# Patient Record
Sex: Male | Born: 1975 | Race: White | Hispanic: No | Marital: Married | Smoking: Never smoker
Health system: Southern US, Community
[De-identification: ages and names within clinical notes are randomized; demographics above are authoritative.]

## PROBLEM LIST (undated history)

## (undated) DIAGNOSIS — M25539 Pain in unspecified wrist: Secondary | ICD-10-CM

## (undated) DIAGNOSIS — F909 Attention-deficit hyperactivity disorder, unspecified type: Secondary | ICD-10-CM

## (undated) DIAGNOSIS — Z9109 Other allergy status, other than to drugs and biological substances: Secondary | ICD-10-CM

## (undated) DIAGNOSIS — S43439A Superior glenoid labrum lesion of unspecified shoulder, initial encounter: Secondary | ICD-10-CM

## (undated) HISTORY — PX: TONSILLECTOMY AND ADENOIDECTOMY: SUR1326

---

## 2012-06-06 ENCOUNTER — Other Ambulatory Visit: Payer: Self-pay | Admitting: Occupational Medicine

## 2012-06-06 ENCOUNTER — Ambulatory Visit (HOSPITAL_BASED_OUTPATIENT_CLINIC_OR_DEPARTMENT_OTHER)
Admission: RE | Admit: 2012-06-06 | Discharge: 2012-06-06 | Disposition: A | Discharge status: Home / Self Care | Payer: Self-pay | Source: Ambulatory Visit | Attending: Occupational Medicine | Admitting: Occupational Medicine

## 2012-06-06 DIAGNOSIS — Z0000 Encounter for general adult medical examination without abnormal findings: Secondary | ICD-10-CM

## 2015-09-14 DIAGNOSIS — E7801 Familial hypercholesterolemia: Secondary | ICD-10-CM | POA: Insufficient documentation

## 2015-09-14 DIAGNOSIS — IMO0002 Reserved for concepts with insufficient information to code with codable children: Secondary | ICD-10-CM | POA: Insufficient documentation

## 2015-09-14 DIAGNOSIS — E78019 Familial hypercholesterolemia, unspecified: Secondary | ICD-10-CM | POA: Insufficient documentation

## 2015-09-14 DIAGNOSIS — F988 Other specified behavioral and emotional disorders with onset usually occurring in childhood and adolescence: Secondary | ICD-10-CM | POA: Insufficient documentation

## 2015-10-30 ENCOUNTER — Encounter (HOSPITAL_BASED_OUTPATIENT_CLINIC_OR_DEPARTMENT_OTHER): Payer: Self-pay | Admitting: *Deleted

## 2015-10-30 ENCOUNTER — Emergency Department (HOSPITAL_BASED_OUTPATIENT_CLINIC_OR_DEPARTMENT_OTHER): Payer: Managed Care, Other (non HMO)

## 2015-10-30 ENCOUNTER — Emergency Department (HOSPITAL_BASED_OUTPATIENT_CLINIC_OR_DEPARTMENT_OTHER)
Admission: EM | Admit: 2015-10-30 | Discharge: 2015-10-30 | Disposition: A | Discharge status: Home / Self Care | Payer: Managed Care, Other (non HMO) | Attending: Emergency Medicine | Admitting: Emergency Medicine

## 2015-10-30 DIAGNOSIS — Y998 Other external cause status: Secondary | ICD-10-CM | POA: Insufficient documentation

## 2015-10-30 DIAGNOSIS — Z79899 Other long term (current) drug therapy: Secondary | ICD-10-CM | POA: Diagnosis not present

## 2015-10-30 DIAGNOSIS — S4992XA Unspecified injury of left shoulder and upper arm, initial encounter: Secondary | ICD-10-CM | POA: Diagnosis present

## 2015-10-30 DIAGNOSIS — Y9389 Activity, other specified: Secondary | ICD-10-CM | POA: Insufficient documentation

## 2015-10-30 DIAGNOSIS — S66812A Strain of other specified muscles, fascia and tendons at wrist and hand level, left hand, initial encounter: Secondary | ICD-10-CM | POA: Diagnosis not present

## 2015-10-30 DIAGNOSIS — Y92410 Unspecified street and highway as the place of occurrence of the external cause: Secondary | ICD-10-CM | POA: Diagnosis not present

## 2015-10-30 DIAGNOSIS — S46812A Strain of other muscles, fascia and tendons at shoulder and upper arm level, left arm, initial encounter: Secondary | ICD-10-CM | POA: Insufficient documentation
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
## 2015-10-30 NOTE — ED Notes (Signed)
Patient transported to X-ray 
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
## 2015-10-30 NOTE — ED Notes (Signed)
Has full ROM of left hand and left arm and shoulder movement, has pain increase in left shoulder with movement, with rest and non-movement pain is reduced

## 2015-11-08 ENCOUNTER — Ambulatory Visit (HOSPITAL_BASED_OUTPATIENT_CLINIC_OR_DEPARTMENT_OTHER)
Admission: RE | Admit: 2015-11-08 | Discharge: 2015-11-08 | Disposition: A | Discharge status: Home / Self Care | Payer: Managed Care, Other (non HMO) | Source: Ambulatory Visit | Attending: Family Medicine | Admitting: Family Medicine

## 2015-11-08 ENCOUNTER — Ambulatory Visit (INDEPENDENT_AMBULATORY_CARE_PROVIDER_SITE_OTHER): Payer: Managed Care, Other (non HMO) | Admitting: Family Medicine

## 2015-11-08 ENCOUNTER — Encounter: Payer: Self-pay | Admitting: Family Medicine

## 2015-11-08 VITALS — BP 140/82 | HR 71 | Ht 74.0 in | Wt 186.0 lb

## 2015-11-08 DIAGNOSIS — S4992XA Unspecified injury of left shoulder and upper arm, initial encounter: Secondary | ICD-10-CM

## 2015-11-08 DIAGNOSIS — M25532 Pain in left wrist: Secondary | ICD-10-CM | POA: Diagnosis not present

## 2015-11-08 DIAGNOSIS — S6992XA Unspecified injury of left wrist, hand and finger(s), initial encounter: Secondary | ICD-10-CM | POA: Diagnosis not present

## 2015-11-08 MED ORDER — BENZONATATE 100 MG PO CAPS: 100.0000 mg | ORAL_CAPSULE | Freq: Three times a day (TID) | ORAL | Status: DC | PRN
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
## 2015-11-09 DIAGNOSIS — S6992XA Unspecified injury of left wrist, hand and finger(s), initial encounter: Secondary | ICD-10-CM | POA: Insufficient documentation

## 2015-11-09 DIAGNOSIS — S4992XA Unspecified injury of left shoulder and upper arm, initial encounter: Secondary | ICD-10-CM | POA: Insufficient documentation
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
## 2015-11-14 ENCOUNTER — Telehealth: Payer: Self-pay | Admitting: Family Medicine

## 2015-11-14 ENCOUNTER — Encounter: Payer: Self-pay | Admitting: Family Medicine

## 2015-11-14 NOTE — Telephone Encounter (Signed)
Note provided

## 2015-11-21 ENCOUNTER — Telehealth: Payer: Self-pay | Admitting: Family Medicine

## 2015-11-22 NOTE — Addendum Note (Signed)
Addended by: Kathi Simpers F on: 11/22/2015 08:40 AM   Modules accepted: Orders

## 2015-12-02 NOTE — Addendum Note (Signed)
Addended by: Lenda KelpHUDNALL, SHANE R on: 12/02/2015 10:01 AM   Modules accepted: SmartSet

## 2015-12-02 NOTE — Addendum Note (Signed)
Addended by: Kathi SimpersWISE, Sallie Staron F on: 12/02/2015 11:18 AM   Modules accepted: Orders

## 2015-12-07 ENCOUNTER — Encounter: Payer: Self-pay | Admitting: Family Medicine

## 2015-12-07 ENCOUNTER — Ambulatory Visit (INDEPENDENT_AMBULATORY_CARE_PROVIDER_SITE_OTHER): Payer: Managed Care, Other (non HMO) | Admitting: Family Medicine

## 2015-12-07 VITALS — BP 125/79 | HR 76 | Ht 74.0 in | Wt 185.0 lb

## 2015-12-07 DIAGNOSIS — M75100 Unspecified rotator cuff tear or rupture of unspecified shoulder, not specified as traumatic: Secondary | ICD-10-CM | POA: Diagnosis not present

## 2015-12-07 DIAGNOSIS — S6992XD Unspecified injury of left wrist, hand and finger(s), subsequent encounter: Secondary | ICD-10-CM

## 2015-12-07 DIAGNOSIS — S4992XA Unspecified injury of left shoulder and upper arm, initial encounter: Secondary | ICD-10-CM | POA: Diagnosis not present

## 2015-12-08 ENCOUNTER — Encounter: Payer: Self-pay | Admitting: Family Medicine
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
## 2015-12-09 ENCOUNTER — Other Ambulatory Visit: Payer: Self-pay | Admitting: Family Medicine

## 2015-12-09 DIAGNOSIS — M259 Joint disorder, unspecified: Secondary | ICD-10-CM | POA: Insufficient documentation

## 2015-12-09 DIAGNOSIS — M75102 Unspecified rotator cuff tear or rupture of left shoulder, not specified as traumatic: Secondary | ICD-10-CM

## 2015-12-10 ENCOUNTER — Ambulatory Visit (HOSPITAL_BASED_OUTPATIENT_CLINIC_OR_DEPARTMENT_OTHER): Payer: Managed Care, Other (non HMO)

## 2015-12-10 ENCOUNTER — Ambulatory Visit (HOSPITAL_BASED_OUTPATIENT_CLINIC_OR_DEPARTMENT_OTHER)
Admission: RE | Admit: 2015-12-10 | Discharge: 2015-12-10 | Disposition: A | Discharge status: Home / Self Care | Payer: Managed Care, Other (non HMO) | Source: Ambulatory Visit | Attending: Family Medicine | Admitting: Family Medicine

## 2015-12-10 DIAGNOSIS — M75102 Unspecified rotator cuff tear or rupture of left shoulder, not specified as traumatic: Secondary | ICD-10-CM | POA: Diagnosis present

## 2015-12-10 DIAGNOSIS — S43492A Other sprain of left shoulder joint, initial encounter: Secondary | ICD-10-CM | POA: Diagnosis not present

## 2015-12-14 ENCOUNTER — Encounter: Payer: Self-pay | Admitting: Family Medicine

## 2015-12-14 ENCOUNTER — Ambulatory Visit (INDEPENDENT_AMBULATORY_CARE_PROVIDER_SITE_OTHER): Payer: Managed Care, Other (non HMO) | Admitting: Family Medicine

## 2015-12-14 ENCOUNTER — Other Ambulatory Visit (HOSPITAL_COMMUNITY): Payer: Self-pay | Admitting: Orthopedic Surgery

## 2015-12-14 VITALS — BP 127/91 | HR 89 | Ht 74.0 in | Wt 185.0 lb

## 2015-12-14 DIAGNOSIS — S4992XD Unspecified injury of left shoulder and upper arm, subsequent encounter: Secondary | ICD-10-CM

## 2015-12-14 DIAGNOSIS — M25532 Pain in left wrist: Secondary | ICD-10-CM

## 2015-12-14 MED ORDER — METHYLPREDNISOLONE ACETATE 40 MG/ML IJ SUSP
40.0000 mg | Freq: Once | INTRAMUSCULAR | Status: AC
  Administered 2015-12-14: 40 mg via INTRA_ARTICULAR
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
## 2015-12-20 ENCOUNTER — Ambulatory Visit (HOSPITAL_COMMUNITY)
Admission: RE | Admit: 2015-12-20 | Discharge: 2015-12-20 | Disposition: A | Discharge status: Home / Self Care | Payer: Managed Care, Other (non HMO) | Source: Ambulatory Visit | Attending: Orthopedic Surgery | Admitting: Orthopedic Surgery

## 2015-12-20 ENCOUNTER — Encounter (HOSPITAL_COMMUNITY): Payer: Managed Care, Other (non HMO)

## 2015-12-20 DIAGNOSIS — M25832 Other specified joint disorders, left wrist: Secondary | ICD-10-CM | POA: Diagnosis not present

## 2015-12-20 DIAGNOSIS — M25532 Pain in left wrist: Secondary | ICD-10-CM | POA: Diagnosis not present

## 2015-12-20 MED ORDER — TECHNETIUM TC 99M MEDRONATE IV KIT
25.0000 | PACK | Freq: Once | INTRAVENOUS | Status: AC | PRN
  Administered 2015-12-20: 25 via INTRAVENOUS

## 2015-12-27 ENCOUNTER — Telehealth: Payer: Self-pay | Admitting: Family Medicine

## 2015-12-27 NOTE — Telephone Encounter (Signed)
That's disappointing - ok to set him up for physical therapy for his shoulder - labral tear, paralabral cyst.  Thanks!

## 2015-12-27 NOTE — Addendum Note (Signed)
Addended by: Kathi SimpersWISE, Chundra Sauerwein F on: 12/27/2015 02:21 PM   Modules accepted: Orders

## 2015-12-28 NOTE — Telephone Encounter (Signed)
Spoke to patient and order sent for physical therapy.

## 2015-12-29 ENCOUNTER — Ambulatory Visit: Payer: Managed Care, Other (non HMO) | Attending: Family Medicine | Admitting: Physical Therapy

## 2015-12-29 DIAGNOSIS — M25612 Stiffness of left shoulder, not elsewhere classified: Secondary | ICD-10-CM | POA: Diagnosis present

## 2015-12-29 DIAGNOSIS — M25512 Pain in left shoulder: Secondary | ICD-10-CM | POA: Diagnosis present
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
## 2016-01-03 ENCOUNTER — Other Ambulatory Visit: Payer: Self-pay | Admitting: Orthopedic Surgery

## 2016-01-03 ENCOUNTER — Ambulatory Visit: Payer: Managed Care, Other (non HMO) | Attending: Family Medicine

## 2016-01-03 DIAGNOSIS — M25512 Pain in left shoulder: Secondary | ICD-10-CM | POA: Diagnosis not present

## 2016-01-03 DIAGNOSIS — M25612 Stiffness of left shoulder, not elsewhere classified: Secondary | ICD-10-CM | POA: Diagnosis present

## 2016-01-03 NOTE — Telephone Encounter (Signed)
Finished

## 2016-01-04 ENCOUNTER — Encounter (HOSPITAL_BASED_OUTPATIENT_CLINIC_OR_DEPARTMENT_OTHER): Payer: Self-pay | Admitting: *Deleted

## 2016-01-04 NOTE — Therapy (Signed)
[facility] [address]  [address] [place], [place], [address] Phone: [phone]   Fax:  [phone]  Physical Therapy Treatment  Patient Details  Name: Dan Villarreal MRN: [id number] Date of Birth: 04-22-1976 Referring Provider: Lenda Kelp, MD  Encounter Date: 01/03/2016      PT End of Session - 01/03/16 1547    Visit Number 2   Number of Visits 12   Date for PT Re-Evaluation 02/09/16   PT Start Time 1536   PT Stop Time 1632   PT Time Calculation (min) 56 min   Activity Tolerance Patient tolerated treatment well   Behavior During Therapy [facility] for tasks assessed/performed      Past Medical History  Diagnosis Date  . ADHD (attention deficit hyperactivity disorder)   . Environmental allergies   . Labral tear of shoulder   . Pain, wrist joint     Past Surgical History  Procedure Laterality Date  . Tonsillectomy and adenoidectomy      There were no vitals filed for this visit.  Visit Diagnosis:  Pain in left shoulder  Stiffness of left shoulder, not elsewhere classified      Subjective Assessment - 01/03/16 1531    Subjective Pt. reports L wrist with no pain initially just with exertion, L shoulder no pain initially.     Patient Stated Goals "To get normal use of my (L) arm back so I can go back to full duty at work and get back to working out."   Currently in Pain? No/denies   Pain Score 0-No pain   Multiple Pain Sites No   Pain Onset --      Today's treatment: Therex: Standing B shoulder circles CW, CCW x 3 min for warmup   Manual: L shoulder inferior, posterior glide mobs grade 3  L shoulder IR, ER, flexion, abduction PROM  Manual STM / TPR to L medial scapular border and L UT 5 min  Scapular mobs all directions   Therex: * All TB activity with TB wrapped around L wrist brace AAROM with pulley L shoulder flexion, abduction x 15 each way 5" hold each Standing ER, IR with red TB wrapped around  wrist 2 x 15 reps  Standing B shoulder extension with green TB x 15 reps;  B shoulder extension with blue TB x 15 reps Standing B shoulder retraction row with blue TB x 15 reps; Standing B shoulder retraction row with black TB x 15 reps Seated low row with strap assist on BATCA machine x 15 reps 25# Laying on Large p-ball:     I's  X 15 reps      T's x 15 reps     Y's x 12 reps; no pain reported however stopped due to fatigue at 12 reps         PT Long Term Goals - 01/04/16 1541    PT LONG TERM GOAL #1   Title Pt will be independent with latest HEP by 02/09/16   Status On-going   PT LONG TERM GOAL #2   Title Pt will demonstrate full AROM of L shoulder without pain by 02/09/16   Status On-going   PT LONG TERM GOAL #3   Title Pt will demonstrate L shoulder strength 4+/5 or greater by 02/09/16   Status On-going   PT LONG TERM GOAL #4   Title Pt will report no restrictions in functional use of L UE with normal household ADL's or chores  due to L shoulder pain or weakness by 02/09/16   Status On-going               Plan - 01/03/16 1548    Clinical Impression Statement Pt. tolerated all L scapular strengthening and L shoulder stretching well today only pain in L wrist with TB pressure with therex; all TB resistance activity for shoulder and scapular strengthening performed by wrapping TB around wrist brace; pt. unable to grip with any activity.  Pt. reports L wrist surgery scheduled for next Tuesday; pt. instructed to contact PT regarding precautions and possibility of future treatment following surgery.  Pt. has one more scheduled PT treatment before surgery.  only therex related muscular "tightness" reported was at L posterior shoulder with resisted shoulder extension / retraction.     PT Next Visit Plan Update HEP to include stretches for increased ROM, Manual therapy and exercises for L shoulder ROM and strengthening; Modalities as needed for pain        Problem List Patient  Active Problem List   Diagnosis Date Noted  . Disorder of wrist joint 12/09/2015  . Injury of left shoulder 11/09/2015  . Left wrist injury 11/09/2015  . ADD (attention deficit disorder) 09/14/2015  . Familial hypercholesterolemia 09/14/2015  . Herniated nucleus pulposus 09/14/2015    Kermit BaloMicah Mykael Batz, PTA 01/04/2016, 4:02 PM  [facility] [address]  [address] [place], [place], [address] Phone: [phone]   Fax:  [phone]  Name: Dan Villarreal MRN: [id number] Date of Birth: 07/20/1976

## 2016-01-05 ENCOUNTER — Telehealth: Payer: Self-pay | Admitting: Family Medicine

## 2016-01-05 ENCOUNTER — Ambulatory Visit: Payer: Managed Care, Other (non HMO)

## 2016-01-05 DIAGNOSIS — M25512 Pain in left shoulder: Secondary | ICD-10-CM | POA: Diagnosis not present

## 2016-01-05 DIAGNOSIS — M25612 Stiffness of left shoulder, not elsewhere classified: Secondary | ICD-10-CM

## 2016-01-05 NOTE — Telephone Encounter (Signed)
Spoke to patient and gave him information provided.  

## 2016-01-05 NOTE — Telephone Encounter (Signed)
It may be best for him to get that from Dr. Merlyn LotKuzma as he will be more familiar with the rehab, recovery process after that surgery.  He can provide them with the appropriate details for that.

## 2016-01-05 NOTE — Therapy (Signed)
[facility] [address]  [address] [place], [place], [address] Phone: [phone]   Fax:  [phone]  Physical Therapy Treatment  Patient Details  Name: Dan Villarreal MRN: [id number] Date of Birth: [birth year] Referring Provider: Lenda Kelp, MD  Encounter Date: 01/05/2016      PT End of Session - 01/05/16 1250    Visit Number 3   Number of Visits 12   Date for PT Re-Evaluation 02/09/16   PT Start Time 1618   PT Stop Time 1702   PT Time Calculation (min) 44 min   Activity Tolerance Patient tolerated treatment well   Behavior During Therapy [facility] for tasks assessed/performed      Past Medical History  Diagnosis Date  . ADHD (attention deficit hyperactivity disorder)   . Environmental allergies   . Labral tear of shoulder   . Pain, wrist joint     Past Surgical History  Procedure Laterality Date  . Tonsillectomy and adenoidectomy      There were no vitals filed for this visit.  Visit Diagnosis:  Pain in left shoulder  Stiffness of left shoulder, not elsewhere classified      Subjective Assessment - 01/05/16 1240    Subjective No pain or other complaints today.  Pt. reports L wrist only with pain when gripping.  Pt. reports L wrist surgery scheduled for Tuesday 4/11.   Patient Stated Goals "To get normal use of my (L) arm back so I can go back to full duty at work and get back to working out."   Currently in Pain? No/denies   Pain Score 0-No pain   Multiple Pain Sites No      Today's treatment:  Therex: Standing B shoulder circles CW, CCW x 3 min for warmup   Manual: L shoulder inferior, posterior glide mobs grade 3  L shoulder IR, ER, flexion, abduction PROM  Manual STM / TPR to L medial scapular border and L UT 5 min  Scapular mobs all directions   AAROM L shoulder ER with wand x 10 reps AAROM L shoulder abduction with wand x 10 reps AAROM L shoulder flexion with want x 10 reps  AROM L  shoulder flexion on big red (75 cm) physioball x 10 reps with   Standing scapular retraction with (looped) black TB x 15 reps Standing scapular retraction with B shoulder extension with blue TB x 15 reps  B BATCA Pulldown with wrist strap assist.            PT Long Term Goals - 01/04/16 1541    PT LONG TERM GOAL #1   Title Pt will be independent with latest HEP by 02/09/16   Status On-going   PT LONG TERM GOAL #2   Title Pt will demonstrate full AROM of L shoulder without pain by 02/09/16   Status On-going   PT LONG TERM GOAL #3   Title Pt will demonstrate L shoulder strength 4+/5 or greater by 02/09/16   Status On-going   PT LONG TERM GOAL #4   Title Pt will report no restrictions in functional use of L UE with normal household ADL's or chores due to L shoulder pain or weakness by 02/09/16   Status On-going               Plan - 01/06/16 1243    Clinical Impression Statement Pt. tolerated all L scapular strengthening and L shoulder stretching well today onlyu pain in L wrist  with TB pressure with therex; all TB resistance activity for shoulder and scapular strengthening performed by wrapping TB around wrist brace; pt. unable to grip with any activity without pain.  STM with red weight theraball with self massage on wall added today with pt. tolerating well reporting relieved tightness following.  Pt. reports L wrist surgery scheduled for next Tuesday 4/11; pt. instructed to contact PT regarding precautions and possibility of future treatment following surgery. Pt. has no more scheduled PT treatments before surgery.     PT Next Visit Plan Update HEP to include stretches for increased ROM, Manual therapy and exercises for L shoulder ROM and strengthening; Modalities as needed for pain        Problem List Patient Active Problem List   Diagnosis Date Noted  . Disorder of wrist joint 12/09/2015  . Injury of left shoulder 11/09/2015  . Left wrist injury 11/09/2015  . ADD  (attention deficit disorder) 09/14/2015  . Familial hypercholesterolemia 09/14/2015  . Herniated nucleus pulposus 09/14/2015    Kermit BaloMicah Neesha Langton, PTA 01/06/2016, 1:03 PM  [facility] [address]  [address] [place], [place], [address] Phone: [phone]   Fax:  [phone]  Name: Dan Villarreal MRN: [id number] Date of Birth: 11/30/1975

## 2016-01-05 NOTE — Telephone Encounter (Signed)
His left wrist having surgery on 01/10/16 w/ Dr.Kuzma.  He needs a letter for FMLA to extend it because of the surgery, to say March 15, 2016.  Please call w/ any questions.

## 2016-01-10 ENCOUNTER — Encounter (HOSPITAL_BASED_OUTPATIENT_CLINIC_OR_DEPARTMENT_OTHER)
Admission: RE | Disposition: A | Discharge status: Home / Self Care | Payer: Self-pay | Source: Ambulatory Visit | Attending: Orthopedic Surgery

## 2016-01-10 ENCOUNTER — Encounter (HOSPITAL_BASED_OUTPATIENT_CLINIC_OR_DEPARTMENT_OTHER): Payer: Self-pay

## 2016-01-10 ENCOUNTER — Ambulatory Visit (HOSPITAL_BASED_OUTPATIENT_CLINIC_OR_DEPARTMENT_OTHER): Payer: Managed Care, Other (non HMO) | Admitting: Anesthesiology

## 2016-01-10 ENCOUNTER — Ambulatory Visit (HOSPITAL_BASED_OUTPATIENT_CLINIC_OR_DEPARTMENT_OTHER)
Admission: RE | Admit: 2016-01-10 | Discharge: 2016-01-10 | Disposition: A | Discharge status: Home / Self Care | Payer: Managed Care, Other (non HMO) | Source: Ambulatory Visit | Attending: Orthopedic Surgery | Admitting: Orthopedic Surgery

## 2016-01-10 DIAGNOSIS — S63592A Other specified sprain of left wrist, initial encounter: Secondary | ICD-10-CM | POA: Insufficient documentation

## 2016-01-10 HISTORY — DX: Pain in unspecified wrist: M25.539

## 2016-01-10 HISTORY — DX: Other allergy status, other than to drugs and biological substances: Z91.09

## 2016-01-10 HISTORY — DX: Attention-deficit hyperactivity disorder, unspecified type: F90.9

## 2016-01-10 HISTORY — PX: WRIST ARTHROSCOPY WITH DEBRIDEMENT: SHX6194

## 2016-01-10 HISTORY — DX: Superior glenoid labrum lesion of unspecified shoulder, initial encounter: S43.439A

## 2016-01-10 SURGERY — WRIST ARTHROSCOPY WITH DEBRIDEMENT
Anesthesia: General | Site: Wrist | Laterality: Left

## 2016-01-10 MED ORDER — GLYCOPYRROLATE 0.2 MG/ML IJ SOLN
0.2000 mg | Freq: Once | INTRAMUSCULAR | Status: AC | PRN
  Administered 2016-01-10 (×2): 0.2 mg via INTRAVENOUS

## 2016-01-10 MED ORDER — PHENYLEPHRINE HCL 10 MG/ML IJ SOLN
INTRAMUSCULAR | Status: DC | PRN
  Administered 2016-01-10: 80 ug via INTRAVENOUS

## 2016-01-10 MED ORDER — HYDROMORPHONE HCL 1 MG/ML IJ SOLN: 0.2500 mg | INTRAMUSCULAR | Status: DC | PRN

## 2016-01-10 MED ORDER — SODIUM CHLORIDE 0.9 % IV SOLN
INTRAVENOUS | Status: DC | PRN
  Administered 2016-01-10: 1000 mL

## 2016-01-10 MED ORDER — CHLORHEXIDINE GLUCONATE 4 % EX LIQD: 60.0000 mL | Freq: Once | CUTANEOUS | Status: DC

## 2016-01-10 MED ORDER — MEPERIDINE HCL 25 MG/ML IJ SOLN: 6.2500 mg | INTRAMUSCULAR | Status: DC | PRN

## 2016-01-10 MED ORDER — MIDAZOLAM HCL 2 MG/2ML IJ SOLN
1.0000 mg | INTRAMUSCULAR | Status: DC | PRN
  Administered 2016-01-10: 2 mg via INTRAVENOUS

## 2016-01-10 MED ORDER — OXYCODONE HCL 5 MG/5ML PO SOLN: 5.0000 mg | Freq: Once | ORAL | Status: DC | PRN

## 2016-01-10 MED ORDER — LACTATED RINGERS IV SOLN
INTRAVENOUS | Status: DC
  Administered 2016-01-10 (×2): via INTRAVENOUS

## 2016-01-10 MED ORDER — DEXAMETHASONE SODIUM PHOSPHATE 4 MG/ML IJ SOLN
INTRAMUSCULAR | Status: DC | PRN
  Administered 2016-01-10: 10 mg via INTRAVENOUS

## 2016-01-10 MED ORDER — MIDAZOLAM HCL 5 MG/5ML IJ SOLN
INTRAMUSCULAR | Status: DC | PRN
  Administered 2016-01-10: 2 mg via INTRAVENOUS

## 2016-01-10 MED ORDER — HYDROCODONE-ACETAMINOPHEN 5-325 MG PO TABS: 1.0000 | ORAL_TABLET | Freq: Four times a day (QID) | ORAL | Status: AC | PRN

## 2016-01-10 MED ORDER — DEXAMETHASONE SODIUM PHOSPHATE 10 MG/ML IJ SOLN
INTRAMUSCULAR | Status: AC
  Filled 2016-01-10: qty 1

## 2016-01-10 MED ORDER — FENTANYL CITRATE (PF) 100 MCG/2ML IJ SOLN
50.0000 ug | INTRAMUSCULAR | Status: DC | PRN
  Administered 2016-01-10: 100 ug via INTRAVENOUS

## 2016-01-10 MED ORDER — OXYCODONE HCL 5 MG PO TABS: 5.0000 mg | ORAL_TABLET | Freq: Once | ORAL | Status: DC | PRN

## 2016-01-10 MED ORDER — CEFAZOLIN SODIUM-DEXTROSE 2-4 GM/100ML-% IV SOLN
INTRAVENOUS | Status: AC
  Filled 2016-01-10: qty 100

## 2016-01-10 MED ORDER — NALOXONE HCL 0.4 MG/ML IJ SOLN
INTRAMUSCULAR | Status: AC
  Filled 2016-01-10: qty 1

## 2016-01-10 MED ORDER — GLYCOPYRROLATE 0.2 MG/ML IJ SOLN
INTRAMUSCULAR | Status: AC
  Filled 2016-01-10: qty 1

## 2016-01-10 MED ORDER — LIDOCAINE HCL (CARDIAC) 20 MG/ML IV SOLN
INTRAVENOUS | Status: AC
  Filled 2016-01-10: qty 5

## 2016-01-10 MED ORDER — FENTANYL CITRATE (PF) 100 MCG/2ML IJ SOLN
INTRAMUSCULAR | Status: AC
Start: 2016-01-10 — End: 2016-01-10
  Filled 2016-01-10: qty 2

## 2016-01-10 MED ORDER — CEFAZOLIN SODIUM-DEXTROSE 2-4 GM/100ML-% IV SOLN
2.0000 g | INTRAVENOUS | Status: AC
  Administered 2016-01-10: 2 g via INTRAVENOUS

## 2016-01-10 MED ORDER — LIDOCAINE HCL (CARDIAC) 20 MG/ML IV SOLN
INTRAVENOUS | Status: DC | PRN
  Administered 2016-01-10: 50 mg via INTRAVENOUS

## 2016-01-10 MED ORDER — PHENYLEPHRINE 40 MCG/ML (10ML) SYRINGE FOR IV PUSH (FOR BLOOD PRESSURE SUPPORT)
PREFILLED_SYRINGE | INTRAVENOUS | Status: AC
  Filled 2016-01-10: qty 10

## 2016-01-10 MED ORDER — MIDAZOLAM HCL 2 MG/2ML IJ SOLN
INTRAMUSCULAR | Status: AC
  Filled 2016-01-10: qty 2

## 2016-01-10 MED ORDER — BUPIVACAINE-EPINEPHRINE (PF) 0.5% -1:200000 IJ SOLN
INTRAMUSCULAR | Status: DC | PRN
  Administered 2016-01-10: 25 mL via PERINEURAL

## 2016-01-10 MED ORDER — ONDANSETRON HCL 4 MG/2ML IJ SOLN
INTRAMUSCULAR | Status: AC
  Filled 2016-01-10: qty 2

## 2016-01-10 MED ORDER — FENTANYL CITRATE (PF) 100 MCG/2ML IJ SOLN
INTRAMUSCULAR | Status: AC
  Filled 2016-01-10: qty 2

## 2016-01-10 MED ORDER — PROPOFOL 10 MG/ML IV BOLUS
INTRAVENOUS | Status: DC | PRN
  Administered 2016-01-10: 200 mg via INTRAVENOUS

## 2016-01-10 MED ORDER — SCOPOLAMINE 1 MG/3DAYS TD PT72: 1.0000 | MEDICATED_PATCH | Freq: Once | TRANSDERMAL | Status: DC | PRN

## 2016-01-10 MED ORDER — EPHEDRINE SULFATE 50 MG/ML IJ SOLN
INTRAMUSCULAR | Status: DC | PRN
  Administered 2016-01-10: 10 mg via INTRAVENOUS

## 2016-01-10 MED ORDER — FENTANYL CITRATE (PF) 100 MCG/2ML IJ SOLN
INTRAMUSCULAR | Status: DC | PRN
  Administered 2016-01-10: 100 ug via INTRAVENOUS

## 2016-01-10 MED ORDER — PROPOFOL 10 MG/ML IV BOLUS
INTRAVENOUS | Status: AC
  Filled 2016-01-10: qty 40

## 2016-01-10 MED ORDER — EPHEDRINE SULFATE 50 MG/ML IJ SOLN
INTRAMUSCULAR | Status: AC
  Filled 2016-01-10: qty 1

## 2016-01-10 SURGICAL SUPPLY — 82 items
BLADE CUDA 2.0 (BLADE) IMPLANT
BLADE EAR TYMPAN 2.5 60D BEAV (BLADE) IMPLANT
BLADE MINI RND TIP GREEN BEAV (BLADE) IMPLANT
BLADE SURG 15 STRL LF DISP TIS (BLADE) ×1 IMPLANT
BLADE SURG 15 STRL SS (BLADE) ×2
BNDG COHESIVE 3X5 TAN STRL LF (GAUZE/BANDAGES/DRESSINGS) ×3 IMPLANT
BNDG ESMARK 4X9 LF (GAUZE/BANDAGES/DRESSINGS) ×3 IMPLANT
BNDG GAUZE ELAST 4 BULKY (GAUZE/BANDAGES/DRESSINGS) ×3 IMPLANT
BUR CUDA 2.9 (BURR) IMPLANT
BUR CUDA 2.9MM (BURR)
BUR FULL RADIUS 2.0 (BURR) IMPLANT
BUR FULL RADIUS 2.0MM (BURR)
BUR FULL RADIUS 2.9 (BURR) ×2 IMPLANT
BUR FULL RADIUS 2.9MM (BURR) ×1
BUR GATOR 2.9 (BURR) IMPLANT
BUR GATOR 2.9MM (BURR)
BUR SPHERICAL 2.9 (BURR) IMPLANT
BUR SPHERICAL 2.9MM (BURR)
CANISTER SUCT 1200ML W/VALVE (MISCELLANEOUS) ×3 IMPLANT
CHLORAPREP W/TINT 26ML (MISCELLANEOUS) ×3 IMPLANT
CORDS BIPOLAR (ELECTRODE) IMPLANT
COVER BACK TABLE 60X90IN (DRAPES) ×3 IMPLANT
COVER MAYO STAND STRL (DRAPES) ×3 IMPLANT
CUFF TOURNIQUET SINGLE 18IN (TOURNIQUET CUFF) ×3 IMPLANT
DRAPE EXTREMITY T 121X128X90 (DRAPE) ×3 IMPLANT
DRAPE IMP U-DRAPE 54X76 (DRAPES) ×3 IMPLANT
DRAPE OEC MINIVIEW 54X84 (DRAPES) IMPLANT
DRAPE SURG 17X23 STRL (DRAPES) ×3 IMPLANT
ELECT SMALL JOINT 90D BASC (ELECTRODE) IMPLANT
GAUZE SPONGE 4X4 12PLY STRL (GAUZE/BANDAGES/DRESSINGS) ×3 IMPLANT
GAUZE XEROFORM 1X8 LF (GAUZE/BANDAGES/DRESSINGS) ×3 IMPLANT
GLOVE BIOGEL PI IND STRL 7.0 (GLOVE) ×1 IMPLANT
GLOVE BIOGEL PI IND STRL 7.5 (GLOVE) ×2 IMPLANT
GLOVE BIOGEL PI IND STRL 8.5 (GLOVE) ×1 IMPLANT
GLOVE BIOGEL PI INDICATOR 7.0 (GLOVE) ×2
GLOVE BIOGEL PI INDICATOR 7.5 (GLOVE) ×4
GLOVE BIOGEL PI INDICATOR 8.5 (GLOVE) ×2
GLOVE SURG ORTHO 8.0 STRL STRW (GLOVE) ×3 IMPLANT
GLOVE SURG SS PI 7.0 STRL IVOR (GLOVE) ×6 IMPLANT
GOWN STRL REUS W/ TWL LRG LVL3 (GOWN DISPOSABLE) ×2 IMPLANT
GOWN STRL REUS W/TWL LRG LVL3 (GOWN DISPOSABLE) ×4
GOWN STRL REUS W/TWL XL LVL3 (GOWN DISPOSABLE) ×3 IMPLANT
IV NS IRRIG 3000ML ARTHROMATIC (IV SOLUTION) IMPLANT
IV SET EXT 30 76VOL 4 MALE LL (IV SETS) ×3 IMPLANT
MANIFOLD NEPTUNE II (INSTRUMENTS) IMPLANT
NDL SAFETY ECLIPSE 18X1.5 (NEEDLE) ×3 IMPLANT
NEEDLE HYPO 18GX1.5 SHARP (NEEDLE) ×6
NEEDLE HYPO 22GX1.5 SAFETY (NEEDLE) ×3 IMPLANT
NEEDLE SPNL 18GX3.5 QUINCKE PK (NEEDLE) IMPLANT
NEEDLE TUOHY 20GX3.5 (NEEDLE) IMPLANT
NS IRRIG 1000ML POUR BTL (IV SOLUTION) IMPLANT
PACK BASIN DAY SURGERY FS (CUSTOM PROCEDURE TRAY) ×3 IMPLANT
PAD CAST 3X4 CTTN HI CHSV (CAST SUPPLIES) ×1 IMPLANT
PADDING CAST ABS 3INX4YD NS (CAST SUPPLIES) ×2
PADDING CAST ABS 4INX4YD NS (CAST SUPPLIES) ×2
PADDING CAST ABS COTTON 3X4 (CAST SUPPLIES) ×1 IMPLANT
PADDING CAST ABS COTTON 4X4 ST (CAST SUPPLIES) ×1 IMPLANT
PADDING CAST COTTON 3X4 STRL (CAST SUPPLIES) ×2
ROUTER HOODED VORTEX 2.9MM (BLADE) IMPLANT
SET ARTHROSCOPY TUBING (MISCELLANEOUS) ×2
SET ARTHROSCOPY TUBING LN (MISCELLANEOUS) ×1 IMPLANT
SET SM JOINT TUBING/CANN (CANNULA) IMPLANT
SLEEVE SCD COMPRESS KNEE MED (MISCELLANEOUS) ×3 IMPLANT
SLING ARM FOAM STRAP LRG (SOFTGOODS) ×3 IMPLANT
SPLINT PLASTER CAST XFAST 3X15 (CAST SUPPLIES) IMPLANT
SPLINT PLASTER XTRA FASTSET 3X (CAST SUPPLIES)
STOCKINETTE 4X48 STRL (DRAPES) ×3 IMPLANT
SUCTION FRAZIER HANDLE 10FR (MISCELLANEOUS)
SUCTION TUBE FRAZIER 10FR DISP (MISCELLANEOUS) IMPLANT
SUT ETHILON 4 0 PS 2 18 (SUTURE) ×3 IMPLANT
SUT MERSILENE 4 0 P 3 (SUTURE) IMPLANT
SUT PDS AB 2-0 CT2 27 (SUTURE) IMPLANT
SUT STEEL 4 0 (SUTURE) IMPLANT
SUT VIC AB 2-0 PS2 27 (SUTURE) IMPLANT
SUT VICRYL 4-0 PS2 18IN ABS (SUTURE) IMPLANT
SYR BULB 3OZ (MISCELLANEOUS) ×3 IMPLANT
SYR CONTROL 10ML LL (SYRINGE) ×3 IMPLANT
TUBE CONNECTING 20'X1/4 (TUBING)
TUBE CONNECTING 20X1/4 (TUBING) IMPLANT
UNDERPAD 30X30 (UNDERPADS AND DIAPERS) ×3 IMPLANT
WAND 1.5 MICROBLATOR (SURGICAL WAND) IMPLANT
WATER STERILE IRR 1000ML POUR (IV SOLUTION) ×3 IMPLANT

## 2016-01-10 NOTE — Anesthesia Postprocedure Evaluation (Signed)
Anesthesia Post Note  Patient: Dan Villarreal  Procedure(s) Performed: Procedure(s) (LRB): ARTHROSCOPY LEFT WRIST DEBRIDEMENT TRIANGULAR FIBROCARTILAGE COMPLEX  (Left)  Patient location during evaluation: PACU Anesthesia Type: General Level of consciousness: awake and alert Pain management: pain level controlled Vital Signs Assessment: post-procedure vital signs reviewed and stable Respiratory status: spontaneous breathing, nonlabored ventilation and respiratory function stable Cardiovascular status: blood pressure returned to baseline and stable Postop Assessment: no signs of nausea or vomiting Anesthetic complications: no    Last Vitals:  Filed Vitals:   01/10/16 1145 01/10/16 1200  BP: 125/90 126/88  Pulse: 84 78  Temp:    Resp: 18 17    Last Pain:  Filed Vitals:   01/10/16 1206  PainSc: 0-No pain                 Rashena Dowling A

## 2016-01-10 NOTE — Op Note (Signed)
Dictation Number [id number]

## 2016-01-10 NOTE — Transfer of Care (Signed)
Immediate Anesthesia Transfer of Care Note  Patient: Dan Villarreal  Procedure(s) Performed: Procedure(s): ARTHROSCOPY LEFT WRIST DEBRIDEMENT TRIANGULAR FIBROCARTILAGE COMPLEX  (Left)  Patient Location: PACU  Anesthesia Type:GA combined with regional for post-op pain  Level of Consciousness: alert  and patient cooperative  Airway & Oxygen Therapy: Patient Spontanous Breathing and Patient connected to face mask oxygen  Post-op Assessment: Report given to RN and Post -op Vital signs reviewed and stable  Post vital signs: Reviewed and stable  Last Vitals:  Filed Vitals:   01/10/16 0838  BP: 132/80  Pulse: 64  Temp: 36.5 C  Resp: 18    Complications: No apparent anesthesia complications
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
## 2016-01-10 NOTE — Anesthesia Procedure Notes (Addendum)
Anesthesia Regional Block:  Infraclavicular brachial plexus block  Pre-Anesthetic Checklist: ,, timeout performed, Correct Patient, Correct Site, Correct Laterality, Correct Procedure, Correct Position, site marked, Risks and benefits discussed,  Surgical consent,  Pre-op evaluation,  At surgeon's request and post-op pain management  Laterality: Left and Upper  Prep: chloraprep       Needles:  Injection technique: Single-shot  Needle Type: Echogenic Stimulator Needle     Needle Length: 5cm 5 cm Needle Gauge: 21 and 21 G    Additional Needles:  Procedures: ultrasound guided (picture in chart) Infraclavicular brachial plexus block Narrative:  Start time: 01/10/2016 9:09 AM End time: 01/10/2016 9:14 AM Injection made incrementally with aspirations every 5 mL.  Performed by: Personally  Anesthesiologist: CREWS, DAVID   Procedure Name: LMA Insertion Date/Time: 01/10/2016 10:33 AM Performed by: [place] DesanctisLINKA, Fartun Paradiso L Pre-anesthesia Checklist: Patient identified, Emergency Drugs available, Suction available, Patient being monitored and Timeout performed Patient Re-evaluated:Patient Re-evaluated prior to inductionOxygen Delivery Method: Circle System Utilized Preoxygenation: Pre-oxygenation with 100% oxygen Intubation Type: IV induction Ventilation: Mask ventilation without difficulty LMA: LMA inserted LMA Size: 5.0 Number of attempts: 1 Airway Equipment and Method: Bite block Placement Confirmation: positive ETCO2 Tube secured with: Tape Dental Injury: Teeth and Oropharynx as per pre-operative assessment       Left Infraclavicular block image

## 2016-01-10 NOTE — Anesthesia Preprocedure Evaluation (Signed)
Anesthesia Evaluation  Patient identified by MRN, date of birth, ID band Patient awake    Reviewed: Allergy & Precautions, NPO status , Patient's Chart, lab work & pertinent test results  Airway Mallampati: I  TM Distance: >3 FB Neck ROM: Full    Dental  (+) Teeth Intact, Dental Advisory Given   Pulmonary    breath sounds clear to auscultation       Cardiovascular  Rhythm:Regular Rate:Normal     Neuro/Psych    GI/Hepatic   Endo/Other    Renal/GU      Musculoskeletal   Abdominal   Peds  Hematology   Anesthesia Other Findings   Reproductive/Obstetrics                             Anesthesia Physical Anesthesia Plan  ASA: I  Anesthesia Plan: General   Post-op Pain Management: GA combined w/ Regional for post-op pain   Induction: Intravenous  Airway Management Planned: LMA  Additional Equipment:   Intra-op Plan:   Post-operative Plan: Extubation in OR  Informed Consent: I have reviewed the patients History and Physical, chart, labs and discussed the procedure including the risks, benefits and alternatives for the proposed anesthesia with the patient or authorized representative who has indicated his/her understanding and acceptance.   Dental advisory given  Plan Discussed with: CRNA, Surgeon and Anesthesiologist  Anesthesia Plan Comments:         Anesthesia Quick Evaluation  

## 2016-01-10 NOTE — Brief Op Note (Signed)
01/10/2016  11:21 AM  PATIENT:  Dan Villarreal  40 y.o. male  PRE-OPERATIVE DIAGNOSIS:  TRIANGULAR FIBROCARTILAGE TEAR LEFT WRIST   POST-OPERATIVE DIAGNOSIS:  TRIANGULAR FIBROCARTILAGE TEAR LEFT WRIST   PROCEDURE:  Procedure(s): ARTHROSCOPY LEFT WRIST DEBRIDEMENT TRIANGULAR FIBROCARTILAGE COMPLEX  (Left)  SURGEON:  Surgeon(s) and Role:    * Cindee SaltGary Simran Mannis, MD - Primary  PHYSICIAN ASSISTANT:   ASSISTANTS: none   ANESTHESIA:   regional and general  EBL:  Total I/O In: 1000 [I.V.:1000] Out: 6 [Blood:6]  BLOOD ADMINISTERED:none  DRAINS: none   LOCAL MEDICATIONS USED:  NONE  SPECIMEN:  No Specimen  DISPOSITION OF SPECIMEN:  N/A  COUNTS:  YES  TOURNIQUET:    DICTATION: .Other Dictation: Dictation Number [id number]  PLAN OF CARE: Discharge to home after PACU  PATIENT DISPOSITION:  PACU - hemodynamically stable.

## 2016-01-10 NOTE — Discharge Instructions (Addendum)
[facility] Center Instructions Hand Surgery  Wound Care: Keep your hand elevated above the level of your heart.  Do not allow it to dangle by your side.  Keep the dressing dry and do not remove it unless your doctor advises you to do so.  He will usually change it at the time of your post-op visit.  Moving your fingers is advised to stimulate circulation but will depend on the site of your surgery.  If you have a splint applied, your doctor will advise you regarding movement.  Activity: Do not drive or operate machinery today.  Rest today and then you may return to your normal activity and work as indicated by your physician.  Diet:  Drink liquids today or eat a light diet.  You may resume a regular diet tomorrow.    General expectations: Pain for two to three days. Fingers may become slightly swollen.  Call your doctor if any of the following occur: Severe pain not relieved by pain medication. Elevated temperature. Dressing soaked with blood. Inability to move fingers. White or bluish color to fingers.  He is given a note for out of work until return to office}.   Post Anesthesia Home Care Instructions  Activity: Get plenty of rest for the remainder of the day. A responsible adult should stay with you for 24 hours following the procedure.  For the next 24 hours, DO NOT: -Drive a car -Advertising copywriterperate machinery -Drink alcoholic beverages -Take any medication unless instructed by your physician -Make any legal decisions or sign important papers.  Meals: Start with liquid foods such as gelatin or soup. Progress to regular foods as tolerated. Avoid greasy, spicy, heavy foods. If nausea and/or vomiting occur, drink only clear liquids until the nausea and/or vomiting subsides. Call your physician if vomiting continues.  Special Instructions/Symptoms: Your throat may feel dry or sore from the anesthesia or the breathing tube placed in your throat during surgery. If this causes discomfort, gargle  with warm salt water. The discomfort should disappear within 24 hours.  If you had a scopolamine patch placed behind your ear for the management of post- operative nausea and/or vomiting:  1. The medication in the patch is effective for 72 hours, after which it should be removed.  Wrap patch in a tissue and discard in the trash. Wash hands thoroughly with soap and water. 2. You may remove the patch earlier than 72 hours if you experience unpleasant side effects which may include dry mouth, dizziness or visual disturbances. 3. Avoid touching the patch. Wash your hands with soap and water after contact with the patch.   Regional Anesthesia Blocks  1. Numbness or the inability to move the "blocked" extremity may last from 3-48 hours after placement. The length of time depends on the medication injected and your individual response to the medication. If the numbness is not going away after 48 hours, call your surgeon.  2. The extremity that is blocked will need to be protected until the numbness is gone and the  Strength has returned. Because you cannot feel it, you will need to take extra care to avoid injury. Because it may be weak, you may have difficulty moving it or using it. You may not know what position it is in without looking at it while the block is in effect.  3. For blocks in the legs and feet, returning to weight bearing and walking needs to be done carefully. You will need to wait until the numbness is entirely gone  and the strength has returned. You should be able to move your leg and foot normally before you try and bear weight or walk. You will need someone to be with you when you first try to ensure you do not fall and possibly risk injury.  4. Bruising and tenderness at the needle site are common side effects and will resolve in a few days.  5. Persistent numbness or new problems with movement should be communicated to the surgeon or the [facility] [phone]  [facility] [phone]).

## 2016-01-10 NOTE — Progress Notes (Signed)
Assisted Dr. Crews with left, ultrasound guided, infraclavicular block. Side rails up, monitors on throughout procedure. See vital signs in flow sheet. Tolerated Procedure well. 

## 2016-01-11 ENCOUNTER — Encounter (HOSPITAL_BASED_OUTPATIENT_CLINIC_OR_DEPARTMENT_OTHER): Payer: Self-pay | Admitting: Orthopedic Surgery
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
## 2016-02-01 ENCOUNTER — Ambulatory Visit: Payer: No Typology Code available for payment source | Attending: Family Medicine | Admitting: Physical Therapy

## 2016-02-01 DIAGNOSIS — M25612 Stiffness of left shoulder, not elsewhere classified: Secondary | ICD-10-CM

## 2016-02-01 DIAGNOSIS — M25512 Pain in left shoulder: Secondary | ICD-10-CM | POA: Diagnosis present

## 2016-02-01 NOTE — Therapy (Signed)
[facility] [address]  [address] [place], [place], [address] Phone: [phone]   Fax:  [phone]  Physical Therapy Treatment  Patient Details  Name: Dan Villarreal MRN: [id number] Date of Birth: 12/28/1975 Referring Provider: Lenda KelpShane R Hudnall, MD  Encounter Date: 02/01/2016      PT End of Session - 02/01/16 1225    Visit Number 4   Number of Visits 12   Date for PT Re-Evaluation 03/02/16   PT Start Time 1145   PT Stop Time 1234   PT Time Calculation (min) 49 min   Activity Tolerance Patient tolerated treatment well   Behavior During Therapy [facility] for tasks assessed/performed      Past Medical History  Diagnosis Date  . ADHD (attention deficit hyperactivity disorder)   . Environmental allergies   . Labral tear of shoulder   . Pain, wrist joint     Past Surgical History  Procedure Laterality Date  . Tonsillectomy and adenoidectomy    . Wrist arthroscopy with debridement Left 01/10/2016    Procedure: ARTHROSCOPY LEFT WRIST DEBRIDEMENT TRIANGULAR FIBROCARTILAGE COMPLEX ;  Surgeon: Cindee SaltGary Kuzma, MD;  Location: [facility];  Service: Orthopedics;  Laterality: Left;    There were no vitals filed for this visit.      Subjective Assessment - 02/01/16 1148    Subjective Pt returning to PT after ~1 month absence due to L wrist surgery. Pt reports no restrictions for L wrist per surgeon, but still finds use of L UE most comfortable if wrist maintained in a neutral position.   Pertinent History 10/30/15 - L shoudler nondisplaced posterior inferior labral tear with associated posterior paralabral cyst formation. 01/10/16 - Arthroscopy and debridement of triangular fibrocartilage complex, L wrist   Patient Stated Goals "To get normal use of my (L) arm back so I can go back to full duty at work and get back to working out."   Currently in Pain? Yes   Pain Score 1   Least 0/10, Avg 1/10, Worst 3-4/10   Pain Location  Shoulder   Pain Orientation Left;Lateral;Posterior   Pain Descriptors / Indicators Tightness;Sore   Pain Score 1  Least/Avg 1/10, Worst 6/10   Pain Location Wrist   Pain Orientation Left   Pain Descriptors / Indicators Dull            [facility] - 02/01/16 1145    Assessment   Medical Diagnosis L shoulder labral tear with paralabral cyst   Referring Provider Lenda KelpShane R Hudnall, MD   Onset Date/Surgical Date 10/30/15   Hand Dominance Right   Next MD Visit TBD - Next week   Prior Function   Level of Independence Independent   Vocation Full time employment  currently on modified duty   Engineer, drillingVocation Requirements Firefighter   Leisure Scuba diving, hunting, hiking, backpacking, fishing, weight lifting   ROM / Strength   AROM / PROM / Strength AROM;Strength   AROM   AROM Assessment Site Shoulder   Right/Left Shoulder Left   Left Shoulder Flexion 156 Degrees   Left Shoulder ABduction 144 Degrees   Left Shoulder Internal Rotation 64 Degrees   Left Shoulder External Rotation 90 Degrees   Strength   Strength Assessment Site Shoulder   Right/Left Shoulder Right;Left   Right Shoulder Flexion 5/5   Right Shoulder ABduction 5/5   Right Shoulder Internal Rotation 5/5   Right Shoulder External Rotation 5/5   Left Shoulder Flexion 4/5  Left Shoulder ABduction 4/5  pain with resistance   Left Shoulder Internal Rotation 4/5   Left Shoulder External Rotation 4-/5         Today's treatment  ROM/MMT check  Manual L Shoulder inferior, posterior glide mobs grade 3  STM to L UT, suprapinatus, middle deltoid & pecs L Shoulder PROM + MWM flexion, abduction & IR  TherEx B BATCA Pulldown with emphasis on scapular retraction & shoulder depression 25# x15 L Shoulder depression + scapular retraction with black TB x15 Standing B Shoulder Row with black TB x15 Standing B Scapular retraction + Shoulder extension with blue TB x15  Standing L Shoulder IR/ER with neutral shoulder  with blue TB x15 Standing B Shoulder ER at 90/90 with green TB x15          PT Education - 02/01/16 1250    Education provided Yes   Education Details Reviewed and updated HEP   Person(s) Educated Patient   Methods Explanation;Demonstration   Comprehension Verbalized understanding;Returned demonstration             PT Long Term Goals - 02/01/16 1308    PT LONG TERM GOAL #1   Title Pt will be independent with latest HEP by 03/02/16   Status On-going   PT LONG TERM GOAL #2   Title Pt will demonstrate full AROM of L shoulder without pain by 03/02/16   Status On-going   PT LONG TERM GOAL #3   Title Pt will demonstrate L shoulder strength 4+/5 or greater by 03/02/16   Status On-going   PT LONG TERM GOAL #4   Title Pt will report no restrictions in functional use of L UE with normal household ADL's or chores due to L shoulder pain or weakness by 03/02/16   Status On-going               Plan - 02/01/16 1244    Clinical Impression Statement Dan Villarreal is returning to PT after nearly 4 week absence due to surgery for arthroscopic debridement of L wrist triangular fibrocartilage complex. Reports he has continued to work on HEP as able but still notes limitations in overhead reaching and weakness in L shoulder. He has demonstrated improvement in L shoulder AROM since eval with ER now equivalent to R and >10 dg improvement in flexion & IT, but abduction only slightly improved. Continued tightness in L UT, supraspinatus, middle deltoid and pecs addressed with STM. Pt also continues to demonstrate tendency to hike L shoulder with overhead elevation, therefore continued to target scapular stabilization and shoulder depression. L shoulder strength currently 4 to 4+/5, except ER 4- to 4/5. Will resume PT per existing POC with time frame extended for additional month to account for delay from wrist surgery.   PT Next Visit Plan Manual therapy and exercises for L shoulder ROM and strengthening;  Scapular stabilization; Modalities as needed for pain   Consulted and Agree with Plan of Care Patient      Patient will benefit from skilled therapeutic intervention in order to improve the following deficits and impairments:  Pain, Decreased range of motion, Impaired flexibility, Decreased strength, Impaired UE functional use  Visit Diagnosis: Pain in left shoulder  Stiffness of left shoulder, not elsewhere classified     Problem List Patient Active Problem List   Diagnosis Date Noted  . Disorder of wrist joint 12/09/2015  . Injury of left shoulder 11/09/2015  . Left wrist injury 11/09/2015  . ADD (attention deficit disorder) 09/14/2015  .  Familial hypercholesterolemia 09/14/2015  . Herniated nucleus pulposus 09/14/2015    Marry Guan, PT, MPT 02/01/2016, 1:12 PM  [facility] [address]  [address] [place], [place], [address] Phone: [phone]   Fax:  [phone]  Name: Dan Villarreal MRN: [id number] Date of Birth: 20-Jun-1976

## 2016-02-08 ENCOUNTER — Ambulatory Visit: Payer: No Typology Code available for payment source

## 2016-02-08 DIAGNOSIS — M25512 Pain in left shoulder: Secondary | ICD-10-CM

## 2016-02-08 DIAGNOSIS — M25612 Stiffness of left shoulder, not elsewhere classified: Secondary | ICD-10-CM

## 2016-02-08 NOTE — Therapy (Signed)
[facility] [address]  [address] [place], [place], [address] Phone: [phone]   Fax:  [phone]  Physical Therapy Treatment  Patient Details  Name: Dan Villarreal MRN: [id number] Date of Birth: 01/08/1976 Referring Provider: Lenda KelpShane R Hudnall, MD  Encounter Date: 02/08/2016      PT End of Session - 02/08/16 0827    Visit Number 5   Number of Visits 12   Date for PT Re-Evaluation 03/02/16   PT Start Time 0804   PT Stop Time 0845   PT Time Calculation (min) 41 min   Activity Tolerance Patient tolerated treatment well   Behavior During Therapy [facility] for tasks assessed/performed      Past Medical History  Diagnosis Date  . ADHD (attention deficit hyperactivity disorder)   . Environmental allergies   . Labral tear of shoulder   . Pain, wrist joint     Past Surgical History  Procedure Laterality Date  . Tonsillectomy and adenoidectomy    . Wrist arthroscopy with debridement Left 01/10/2016    Procedure: ARTHROSCOPY LEFT WRIST DEBRIDEMENT TRIANGULAR FIBROCARTILAGE COMPLEX ;  Surgeon: Cindee SaltGary Kuzma, MD;  Location: [facility];  Service: Orthopedics;  Laterality: Left;    There were no vitals filed for this visit.      Subjective Assessment - 02/08/16 0809    Subjective Pt. reports being sore following last treatments shoulder therex, with posterior shoulder joint pain with a dull ache 2/10.     Patient Stated Goals "To get normal use of my (L) arm back so I can go back to full duty at work and get back to working out."   Currently in Pain? No/denies   Pain Score 0-No pain   Multiple Pain Sites No   Pain Score 0            [facility] PT Assessment - 02/08/16 0815    AROM   AROM Assessment Site Shoulder   Right/Left Shoulder Left   Left Shoulder Flexion 155 Degrees   Left Shoulder ABduction 146 Degrees   Left Shoulder Internal Rotation 74 Degrees   Left Shoulder External Rotation 90 Degrees       Today's treatment:  Manual:  L Shoulder inferior, posterior glide mobs grade 3   STM to L UT, suprapinatus, middle deltoid & pecs  L Shoulder PROM + MWM flexion, abduction & IR   TherEx: Supine B ER with red TB x 15 reps B BATCA Pulldown with emphasis on scapular retraction & shoulder depression 35# x 15 reps BATCA low row 25# x 15 reps Standing scapular retraction with black TB x 15 reps Standing B Scapular retraction + Shoulder extension with blue TB x 15 reps  Standing L Shoulder IR/ER with neutral shoulder with red TB x 15 reps     ROM check         PT Long Term Goals - 02/01/16 1308    PT LONG TERM GOAL #1   Title Pt will be independent with latest HEP by 03/02/16   Status On-going   PT LONG TERM GOAL #2   Title Pt will demonstrate full AROM of L shoulder without pain by 03/02/16   Status On-going   PT LONG TERM GOAL #3   Title Pt will demonstrate L shoulder strength 4+/5 or greater by 03/02/16   Status On-going   PT LONG TERM GOAL #4   Title Pt will report no restrictions in functional use of L UE  with normal household ADL's or chores due to L shoulder pain or weakness by 03/02/16   Status On-going               Plan - 02/08/16 1610    Clinical Impression Statement Pt. able to tolerate advacement of scapular strengthening well without L wrist pain and only mild L shoulder discomfort reported with L D1 shoulder extension.  Pt. with slightly improved L shoulder AROM today; 155 dg flex, 146 dg abd, 74 dg IR, 90 dg ER; with IR improved by 10 dg since last measured on 5/3.     PT Treatment/Interventions Patient/family education;Therapeutic exercise;Manual techniques;Passive range of motion;Dry needling;Taping;Therapeutic activities;Ultrasound;Moist Heat;Electrical Stimulation;Cryotherapy;Vasopneumatic Device;Iontophoresis /ml Dexamethasone   PT Next Visit Plan Manual therapy and exercises for L shoulder ROM and strengthening; Scapular stabilization; Modalities as  needed for pain      Patient will benefit from skilled therapeutic intervention in order to improve the following deficits and impairments:  Pain, Decreased range of motion, Impaired flexibility, Decreased strength, Impaired UE functional use  Visit Diagnosis: Pain in left shoulder  Stiffness of left shoulder, not elsewhere classified     Problem List Patient Active Problem List   Diagnosis Date Noted  . Disorder of wrist joint 12/09/2015  . Injury of left shoulder 11/09/2015  . Left wrist injury 11/09/2015  . ADD (attention deficit disorder) 09/14/2015  . Familial hypercholesterolemia 09/14/2015  . Herniated nucleus pulposus 09/14/2015    Kermit Balo, PTA 02/09/2016, 11:53 AM  [facility] [address]  [address] [place], [place], [address] Phone: [phone]   Fax:  [phone]  Name: AZELL BILL MRN: [id number] Date of Birth: 1976-08-03

## 2016-02-13 ENCOUNTER — Telehealth: Payer: Self-pay | Admitting: Family Medicine

## 2016-02-13 ENCOUNTER — Encounter: Payer: Self-pay | Admitting: Family Medicine

## 2016-02-13 ENCOUNTER — Ambulatory Visit (INDEPENDENT_AMBULATORY_CARE_PROVIDER_SITE_OTHER): Payer: Managed Care, Other (non HMO) | Admitting: Family Medicine

## 2016-02-13 VITALS — BP 126/83 | HR 90 | Ht 74.0 in | Wt 185.0 lb

## 2016-02-13 DIAGNOSIS — S4992XD Unspecified injury of left shoulder and upper arm, subsequent encounter: Secondary | ICD-10-CM

## 2016-02-13 NOTE — Patient Instructions (Signed)
I'm pleased with the progress in your shoulder. I think you can be more aggressive with the motion here to regain this. Continue with physical therapy and home exercises. We can do a couple more injections into the joint itself if the motion continues to be plateaued but most people at this point (if not in pain) can do this with therapy and home exercises. Follow up with me in 6 weeks.

## 2016-02-13 NOTE — Telephone Encounter (Signed)
When we talked he said he was on light duty from Dr. Merlyn LotKuzma through the 24th when he has an appointment there.  Does he need a note saying he's cleared for his shoulder but not his wrist?  Please ask, Lynette.

## 2016-02-14 ENCOUNTER — Encounter: Payer: Self-pay | Admitting: Family Medicine

## 2016-02-14 ENCOUNTER — Ambulatory Visit: Payer: No Typology Code available for payment source

## 2016-02-14 DIAGNOSIS — M25612 Stiffness of left shoulder, not elsewhere classified: Secondary | ICD-10-CM

## 2016-02-14 DIAGNOSIS — M25512 Pain in left shoulder: Secondary | ICD-10-CM | POA: Diagnosis not present

## 2016-02-14 NOTE — Therapy (Signed)
[facility] [address]  [address] [place], [place], [address] Phone: [phone]   Fax:  [phone]  Physical Therapy Treatment  Patient Details  Name: Dan Villarreal MRN: [id number] Date of Birth: [birth year] Referring Provider: Lenda Kelp, MD  Encounter Date: 02/14/2016      PT End of Session - 02/14/16 0808    Visit Number 6   Number of Visits 12   Date for PT Re-Evaluation 03/02/16   PT Start Time 0804   PT Stop Time 0850   PT Time Calculation (min) 46 min   Activity Tolerance Patient tolerated treatment well   Behavior During Therapy [facility] for tasks assessed/performed      Past Medical History  Diagnosis Date  . ADHD (attention deficit hyperactivity disorder)   . Environmental allergies   . Labral tear of shoulder   . Pain, wrist joint     Past Surgical History  Procedure Laterality Date  . Tonsillectomy and adenoidectomy    . Wrist arthroscopy with debridement Left 01/10/2016    Procedure: ARTHROSCOPY LEFT WRIST DEBRIDEMENT TRIANGULAR FIBROCARTILAGE COMPLEX ;  Surgeon: Cindee Salt, MD;  Location: [facility];  Service: Orthopedics;  Laterality: Left;    There were no vitals filed for this visit.      Subjective Assessment - 02/14/16 0806    Subjective Pt. reports only mild 1/10 L shoulder / L wrist pain today initially.      Patient Stated Goals "To get normal use of my (L) arm back so I can go back to full duty at work and get back to working out."   Currently in Pain? Yes   Pain Score 1    Pain Location Shoulder   Pain Orientation Left;Lateral;Posterior   Pain Descriptors / Indicators Tightness;Sore   Pain Type Acute pain   Pain Onset More than a month ago   Pain Frequency Occasional   Multiple Pain Sites Yes   Pain Score 1   Pain Location Wrist   Pain Orientation Left   Pain Descriptors / Indicators Dull   Pain Type Acute pain   Pain Onset More than a month ago       Today's treatment:  TherEx: Single arm row on BATCA cable machine 20# x 10 reps each arm BATCA pulldown 35# x 10 reps; x 10 reps 45#  BATCA low row 45# x 10 reps On 1/2 half white bolster:        2# dumbbell x 15 reps        2# dumbbell x 15 reps  L shoulder IR with green TB in door x 10 reps  L shoulder ER with red TB in door x 10 reps Standing scapular retraction / B shoulder ER with blue TB x 10 reps Standing scapular retraction with black TB x 15 reps Standing B Scapular retraction + Shoulder extension with blue TB x 15 reps  Prone on knees push up on 55cm (green) p-bal x 10 reps  Prone on knees push up on peanut p-ball x 10 reps        PT Long Term Goals - 02/01/16 1308    PT LONG TERM GOAL #1   Title Pt will be independent with latest HEP by 03/02/16   Status On-going   PT LONG TERM GOAL #2   Title Pt will demonstrate full AROM of L shoulder without pain by 03/02/16   Status On-going   PT LONG TERM GOAL #3  Title Pt will demonstrate L shoulder strength 4+/5 or greater by 03/02/16   Status On-going   PT LONG TERM GOAL #4   Title Pt will report no restrictions in functional use of L UE with normal household ADL's or chores due to L shoulder pain or weakness by 03/02/16   Status On-going               Plan - 02/14/16 0811    Clinical Impression Statement Pt. reports MD very pleased with pt. progress with PT to this point.  Pt. tolerated increased resistance with shoulder and scapular strengthening activity very well with only occasional mild L shoulder pain at end range scapular retraction; pt. able to operate within painfree range when reminded.  2# dumbbell flexion / scaption raises introduced in standing with pt. tolerating well today.    PT Treatment/Interventions Patient/family education;Therapeutic exercise;Manual techniques;Passive range of motion;Dry needling;Taping;Therapeutic activities;Ultrasound;Moist Heat;Electrical Stimulation;Cryotherapy;Vasopneumatic  Device;Iontophoresis 4mg /ml Dexamethasone   PT Next Visit Plan Manual therapy and exercises for L shoulder ROM and strengthening; Scapular stabilization; Modalities as needed for pain      Patient will benefit from skilled therapeutic intervention in order to improve the following deficits and impairments:  Pain, Decreased range of motion, Impaired flexibility, Decreased strength, Impaired UE functional use  Visit Diagnosis: Pain in left shoulder  Stiffness of left shoulder, not elsewhere classified     Problem List Patient Active Problem List   Diagnosis Date Noted  . Disorder of wrist joint 12/09/2015  . Injury of left shoulder 11/09/2015  . Left wrist injury 11/09/2015  . ADD (attention deficit disorder) 09/14/2015  . Familial hypercholesterolemia 09/14/2015  . Herniated nucleus pulposus 09/14/2015    Kermit BaloMicah Zarai Orsborn, PTA 02/14/2016, 12:27 PM  [facility] [place] [address]  [address] [place], [place], [address] Phone: [phone]   Fax:  [phone]  Name: Sharyne Peachravis E Bake MRN: [id number] Date of Birth: 11/14/1975

## 2016-02-14 NOTE — Telephone Encounter (Signed)
Spoke with patient on 02-14-16. Letter given to patient.
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
## 2016-02-16 ENCOUNTER — Ambulatory Visit: Payer: No Typology Code available for payment source

## 2016-02-16 DIAGNOSIS — M25612 Stiffness of left shoulder, not elsewhere classified: Secondary | ICD-10-CM

## 2016-02-16 DIAGNOSIS — M25512 Pain in left shoulder: Secondary | ICD-10-CM

## 2016-02-16 NOTE — Therapy (Signed)
[facility] [address]  [address] [place], [place], [address] Phone: [phone]   Fax:  [phone]  Physical Therapy Treatment  Patient Details  Name: Dan Villarreal MRN: [id number] Date of Birth: March 11, 1976 Referring Provider: Lenda Kelp, MD  Encounter Date: 02/16/2016      PT End of Session - 02/16/16 0936    Visit Number 7   Number of Visits 12   Date for PT Re-Evaluation 03/02/16   PT Start Time 0849   PT Stop Time 0932   PT Time Calculation (min) 43 min   Activity Tolerance Patient tolerated treatment well   Behavior During Therapy [facility] for tasks assessed/performed      Past Medical History  Diagnosis Date  . ADHD (attention deficit hyperactivity disorder)   . Environmental allergies   . Labral tear of shoulder   . Pain, wrist joint     Past Surgical History  Procedure Laterality Date  . Tonsillectomy and adenoidectomy    . Wrist arthroscopy with debridement Left 01/10/2016    Procedure: ARTHROSCOPY LEFT WRIST DEBRIDEMENT TRIANGULAR FIBROCARTILAGE COMPLEX ;  Surgeon: Cindee Salt, MD;  Location: [facility];  Service: Orthopedics;  Laterality: Left;    There were no vitals filed for this visit.      Subjective Assessment - 02/16/16 1324    Subjective Pt. reports he was a little sore this morning in both shoulders from doing a few extra sets of the scapular strengthening activities at home, however feels good otherwise.     Currently in Pain? No/denies   Pain Score 0-No pain   Multiple Pain Sites No     Today's treatment: Therex: UBE: 2.0 level, 2.71min forwards/backwards  Manual: L shoulder PROM all directions  TherEx: B shoulder flexion with wand and 3# cuffweight from (90 dg > 170dg) B Single arm row on BATCA cable machine 25# x 10 reps each arm B Single arm row on BATCA cable machine 15# standing on blue foam oval  x 10 reps each arm BATCA pulldown 35# x 10 reps; x 10 reps 45#   BATCA low row 35# x 10 reps; x 10 reps 45#  On 1/2 half white bolster:  B shoulder flexion 2# dumbbell x 15 reps; L shoulder flexion 3# x 15 reps  B shoulder scaption 2# dumbbell x 15 reps; L shoulder scaption 3# x 15 reps         B shoulder ER with red TB in door x 15 reps L shoulder IR with green TB in door x 10 reps    AROM Assessment     [facility] PT Assessment - 02/16/16 0001    AROM   AROM Assessment Site Shoulder   Right/Left Shoulder Left   Left Shoulder Flexion 161 Degrees   Left Shoulder ABduction 144 Degrees   Left Shoulder Internal Rotation 60 Degrees   Left Shoulder External Rotation 98 Degrees             PT Long Term Goals - 02/01/16 1308    PT LONG TERM GOAL #1   Title Pt will be independent with latest HEP by 03/02/16   Status On-going   PT LONG TERM GOAL #2   Title Pt will demonstrate full AROM of L shoulder without pain by 03/02/16   Status On-going   PT LONG TERM GOAL #3   Title Pt will demonstrate L shoulder strength 4+/5 or greater by 03/02/16   Status On-going  PT LONG TERM GOAL #4   Title Pt will report no restrictions in functional use of L UE with normal household ADL's or chores due to L shoulder pain or weakness by 03/02/16   Status On-going               Plan - 02/16/16 0936    Clinical Impression Statement Pt. performed well with all scapular and shoulder strengthening activity today; able to tolerate B shoulder flexion / scaption with #3 dumbbdell very well; pt. with no L shoulder or wrist pain this treatment with activity.  Pt. to see PT on 5/22 for treatment 8#/12#.   PT Treatment/Interventions Patient/family education;Therapeutic exercise;Manual techniques;Passive range of motion;Dry needling;Taping;Therapeutic activities;Ultrasound;Moist Heat;Electrical Stimulation;Cryotherapy;Vasopneumatic Device;Iontophoresis 4mg /ml Dexamethasone   PT Next Visit Plan Manual therapy and exercises for L shoulder ROM and strengthening;  Scapular stabilization; Modalities as needed for pain      Patient will benefit from skilled therapeutic intervention in order to improve the following deficits and impairments:  Pain, Decreased range of motion, Impaired flexibility, Decreased strength, Impaired UE functional use  Visit Diagnosis: Pain in left shoulder  Stiffness of left shoulder, not elsewhere classified     Problem List Patient Active Problem List   Diagnosis Date Noted  . Disorder of wrist joint 12/09/2015  . Injury of left shoulder 11/09/2015  . Left wrist injury 11/09/2015  . ADD (attention deficit disorder) 09/14/2015  . Familial hypercholesterolemia 09/14/2015  . Herniated nucleus pulposus 09/14/2015    Kermit BaloMicah Euretha Najarro, PTA 02/16/2016, 1:48 PM  [facility] [address]  [address] [place], [place], [address] Phone: [phone]   Fax:  [phone]  Name: Dan Villarreal MRN: [id number] Date of Birth: 01/22/1976

## 2016-02-20 ENCOUNTER — Ambulatory Visit: Payer: No Typology Code available for payment source | Admitting: Physical Therapy

## 2016-02-20 DIAGNOSIS — M25612 Stiffness of left shoulder, not elsewhere classified: Secondary | ICD-10-CM

## 2016-02-20 DIAGNOSIS — M25512 Pain in left shoulder: Secondary | ICD-10-CM

## 2016-02-20 NOTE — Therapy (Signed)
[facility] [address]  [address] [place], [place], [address] Phone: [phone]   Fax:  [phone]  Physical Therapy Treatment  Patient Details  Name: Dan Villarreal E Okelley MRN: [id number] Date of Birth: 05/31/1976 Referring Provider: Lenda KelpShane R Hudnall, MD  Encounter Date: 02/20/2016      PT End of Session - 02/20/16 1113    Visit Number 8   Number of Visits 12   Date for PT Re-Evaluation 03/02/16   PT Start Time 1107   PT Stop Time 1154   PT Time Calculation (min) 47 min   Activity Tolerance Patient tolerated treatment well   Behavior [facility] for tasks assessed/performed      Past Medical History  Diagnosis Date  . ADHD (attention deficit hyperactivity disorder)   . Environmental allergies   . Labral tear of shoulder   . Pain, wrist joint     Past Surgical History  Procedure Laterality Date  . Tonsillectomy and adenoidectomy    . Wrist arthroscopy with debridement Left 01/10/2016    Procedure: ARTHROSCOPY LEFT WRIST DEBRIDEMENT TRIANGULAR FIBROCARTILAGE COMPLEX ;  Surgeon: Cindee SaltGary Kuzma, MD;  Location: [facility];  Service: Orthopedics;  Laterality: Left;    There were no vitals filed for this visit.      Subjective Assessment - 02/20/16 1111    Subjective Pt reports he has been released by Dr. Pearletha ForgeHudnall for his shoulder but waiting to see his surgeon on Wed to see where he stands for his wrist as far as resuming full duty at work. States shoulder pain is mild and does not interfere with normal use of L UE.   Currently in Pain? Yes   Pain Score 1    Pain Location Shoulder   Pain Orientation Left;Lateral;Posterior          Today's treatment  TherEx UBE - lvl 3.0, fwd/back x 90" each BATCA pulldown 35# x 10 reps; x 10 reps 45#   Manual L shoulder grade 2-3 A/P and inferior mobs L shoulder PROM with MWM all directions L Cross-body Shoulder horizontal adduction stretch x30"  TherEx L  Shoulder serratus punch 5# x10 L Shoulder circles at 90 flexion CW/CCW 5# x10 each Prone over green (65 cm) Pball - I's, T's, W's & Y's x10 each Standing against 1/2 foam roll on wall:    B shoulder flexion 3# db x15    B shoulder scaption 3# db x15  B shoulder ER with green TB x15    L shoulder D2 flexion with red TB x15 L shoulder IR & ER with green TB in door x10 B shoulder ER at 90/90 + "W" scapular retraction with green TB x15          PT Long Term Goals - 02/20/16 1217    PT LONG TERM GOAL #1   Title Pt will be independent with latest HEP by 03/02/16   Status On-going   PT LONG TERM GOAL #2   Title Pt will demonstrate full AROM of L shoulder without pain by 03/02/16   Status On-going   PT LONG TERM GOAL #3   Title Pt will demonstrate L shoulder strength 4+/5 or greater by 03/02/16   Status On-going   PT LONG TERM GOAL #4   Title Pt will report no restrictions in functional use of L UE with normal household ADL's or chores due to L shoulder pain or weakness by 03/02/16   Status On-going  Plan - 02/20/16 1208    Clinical Impression Statement Pt reports only mild continued posterior shoulder pain with continued tightness in posterior capsule, therefore worked on [employer], stretching and joint mobs to promote improved flexibility and ROM. Continued shoulder strengthening and scapular stabilization with good pt tolerance including progression of resistance with some exercises.   PT Treatment/Interventions Patient/family education;Therapeutic exercise;Manual techniques;Passive range of motion;Dry needling;Taping;Therapeutic activities;Ultrasound;Moist Heat;Electrical Stimulation;Cryotherapy;Vasopneumatic Device;Iontophoresis /ml Dexamethasone   PT Next Visit Plan Manual therapy and exercises for L shoulder ROM and strengthening; Scapular stabilization; Modalities as needed for pain      Patient will benefit from skilled therapeutic intervention in order to  improve the following deficits and impairments:  Pain, Decreased range of motion, Impaired flexibility, Decreased strength, Impaired UE functional use  Visit Diagnosis: Pain in left shoulder  Stiffness of left shoulder, not elsewhere classified     Problem List Patient Active Problem List   Diagnosis Date Noted  . Disorder of wrist joint 12/09/2015  . Injury of left shoulder 11/09/2015  . Left wrist injury 11/09/2015  . ADD (attention deficit disorder) 09/14/2015  . Familial hypercholesterolemia 09/14/2015  . Herniated nucleus pulposus 09/14/2015    Marry Guan, PT, MPT 02/20/2016, 12:18 PM  [facility] [address]  [address] [place], [place], [address] Phone: [phone]   Fax:  [phone]  Name: Dan Villarreal MRN: [id number] Date of Birth: Nov 22, 1975

## 2016-02-23 ENCOUNTER — Ambulatory Visit: Payer: No Typology Code available for payment source | Admitting: Physical Therapy

## 2016-02-23 DIAGNOSIS — M25512 Pain in left shoulder: Secondary | ICD-10-CM | POA: Diagnosis not present

## 2016-02-23 DIAGNOSIS — M25612 Stiffness of left shoulder, not elsewhere classified: Secondary | ICD-10-CM

## 2016-02-23 NOTE — Therapy (Signed)
[facility] [address]  [address] [place], [place], [address] Phone: [phone]   Fax:  [phone]  Physical Therapy Treatment  Patient Details  Name: Dan Villarreal MRN: [id number] Date of Birth: 1976-07-19 Referring Provider: Lenda Kelp, MD  Encounter Date: 02/23/2016      PT End of Session - 02/23/16 0809    Visit Number 9   Number of Visits 12   Date for PT Re-Evaluation 03/02/16   PT Start Time 0802   PT Stop Time 0844   PT Time Calculation (min) 42 min   Activity Tolerance Patient tolerated treatment well   Behavior During Therapy [facility] for tasks assessed/performed      Past Medical History  Diagnosis Date  . ADHD (attention deficit hyperactivity disorder)   . Environmental allergies   . Labral tear of shoulder   . Pain, wrist joint     Past Surgical History  Procedure Laterality Date  . Tonsillectomy and adenoidectomy    . Wrist arthroscopy with debridement Left 01/10/2016    Procedure: ARTHROSCOPY LEFT WRIST DEBRIDEMENT TRIANGULAR FIBROCARTILAGE COMPLEX ;  Surgeon: Cindee Salt, MD;  [facility];  Service: Orthopedics;  Laterality: Left;    There were no vitals filed for this visit.      Subjective Assessment - 02/23/16 0805    Subjective Pt reports he has been helping his girlfriend move and has tolerated this well. Saw wrist surgeon yesterday and was not yet released for return to work. MD wants him to start therapy with hand therapist for 3 weeks first.   Currently in Pain? No/denies           Today's treatment   TherEx L Shoulder circles at 90 flexion CW/CCW with small ball on wall x10 each Standing against 1/2 foam roll on wall:  B shoulder horiz ABD with green TB x15    B shoulder ER with green TB x15  L shoulder D2 flexion with green TB x15    L shoulder flexion with green TB x15 Prone over green (65 cm) Pball - I's, T's, W's & Y's 1# x15 each Prone  over edge of mat table alternating shoulder protraction rock on inverted BOSU x15 BATCA pulldown 45# x15 BATCA low row 45# x15, mid row 45# x15 B shoulder ER at 90/90 + "W" scapular retraction with green TB x15 TRX Low Row x15 L shoulder IR & ER with green TB in door x15 BATCA Chest Press Plus 25# x15           PT Long Term Goals - 02/20/16 1217    PT LONG TERM GOAL #1   Title Pt will be independent with latest HEP by 03/02/16   Status On-going   PT LONG TERM GOAL #2   Title Pt will demonstrate full AROM of L shoulder without pain by 03/02/16   Status On-going   PT LONG TERM GOAL #3   Title Pt will demonstrate L shoulder strength 4+/5 or greater by 03/02/16   Status On-going   PT LONG TERM GOAL #4   Title Pt will report no restrictions in functional use of L UE with normal household ADL's or chores due to L shoulder pain or weakness by 03/02/16   Status On-going               Plan - 02/23/16 0844    Clinical Impression Statement Pt to start therapt for wrist with hand specialist x  3 weeks before cleared to return to full duty at work. Shoulder pain remains minimal to absent iwth pt reporting more of a sensation of increased awareness than pain at this point. Progressing well, tolerating increasingly dynamic and weight bearing activities during therapy and increased use of UE outside of therapy w/o increaased pain.   PT Treatment/Interventions Patient/family education;Therapeutic exercise;Manual techniques;Passive range of motion;Dry needling;Taping;Therapeutic activities;Ultrasound;Moist Heat;Electrical Stimulation;Cryotherapy;Vasopneumatic Device;Iontophoresis 4mg /ml Dexamethasone   PT Next Visit Plan Manual therapy and exercises for L shoulder ROM and strengthening; Scapular stabilization; Modalities as needed for pain   Consulted and Agree with Plan of Care Patient      Patient will benefit from skilled therapeutic intervention in order to improve the following deficits and  impairments:  Pain, Decreased range of motion, Impaired flexibility, Decreased strength, Impaired UE functional use  Visit Diagnosis: Pain in left shoulder  Stiffness of left shoulder, not elsewhere classified     Problem List Patient Active Problem List   Diagnosis Date Noted  . Disorder of wrist joint 12/09/2015  . Injury of left shoulder 11/09/2015  . Left wrist injury 11/09/2015  . ADD (attention deficit disorder) 09/14/2015  . Familial hypercholesterolemia 09/14/2015  . Herniated nucleus pulposus 09/14/2015    Marry GuanJoAnne M Brigitt Mcclish, PT, MPT 02/23/2016, 9:21 AM  [facility] [address]  [address] [place], [place], [address] Phone: [phone]   Fax:  [phone]  Name: Sharyne Peachravis E Kaseman MRN: [id number] Date of Birth: 11/20/1975

## 2016-02-29 ENCOUNTER — Ambulatory Visit: Payer: No Typology Code available for payment source

## 2016-02-29 ENCOUNTER — Encounter: Payer: Self-pay | Admitting: Family Medicine

## 2016-02-29 DIAGNOSIS — M25512 Pain in left shoulder: Secondary | ICD-10-CM | POA: Diagnosis not present

## 2016-02-29 DIAGNOSIS — M25612 Stiffness of left shoulder, not elsewhere classified: Secondary | ICD-10-CM

## 2016-02-29 NOTE — Therapy (Signed)
[facility] [address]  [address] [place], [place], [address] Phone: [phone]   Fax:  [phone]  Physical Therapy Treatment  Patient Details  Name: Dan Villarreal MRN: [id number] Date of Birth: 05/17/1976 Referring Provider: Lenda KelpShane R Hudnall, MD  Encounter Date: 02/29/2016      PT End of Session - 02/29/16 1602    Visit Number 10   Number of Visits 12   Date for PT Re-Evaluation 03/02/16   PT Start Time 0805   PT Stop Time 0845   PT Time Calculation (min) 40 min   Activity Tolerance Patient tolerated treatment well   Behavior During Therapy [facility] for tasks assessed/performed      Past Medical History  Diagnosis Date  . ADHD (attention deficit hyperactivity disorder)   . Environmental allergies   . Labral tear of shoulder   . Pain, wrist joint     Past Surgical History  Procedure Laterality Date  . Tonsillectomy and adenoidectomy    . Wrist arthroscopy with debridement Left 01/10/2016    Procedure: ARTHROSCOPY LEFT WRIST DEBRIDEMENT TRIANGULAR FIBROCARTILAGE COMPLEX ;  Surgeon: Cindee SaltGary Kuzma, MD;  Location: [facility];  Service: Orthopedics;  Laterality: Left;    There were no vitals filed for this visit.      Subjective Assessment - 02/29/16 1552    Subjective Pt. reports his shoulder and wrist performed very well with helping his girlfriend move over the weekend.  Pt. reports he is pain free at this point.     Currently in Pain? No/denies   Pain Score 0-No pain   Multiple Pain Sites No     Today's treatment:  TherEx: Supine L Shoulder circles at 90 flexion CW/CCW x 15 each Standing against 1/2 foam roll on wall:     B shoulder horiz ABD with blue TB x 15 reps      B shoulder ER with green TB x 15 reps      L shoulder D2 flexion with green TB x 15 reps      L shoulder flexion with green TB x 15 reps  Prone over green (65 cm) Pball - I's, T's, W's & Y's 2# x10 each way BATCA  pulldown 55# x 15 reps  BATCA low row 45# x15, mid row 55# x 10 reps  TRX Low Row x 10 reps  ~ 45 dg  TRX retraction shoulder ER ~ 20 dg  L shoulder IR & ER with green TB in door x 15 reps  BATCA Chest Press Plus 35# x15        PT Long Term Goals - 02/29/16 0847    PT LONG TERM GOAL #1   Title Pt will be independent with latest HEP by 03/02/16   Status Achieved  02/29/16: pt. independent with latest HEP.     PT LONG TERM GOAL #2   Title Pt will demonstrate full AROM of L shoulder without pain by 03/02/16   Status On-going   PT LONG TERM GOAL #3   Title Pt will demonstrate L shoulder strength 4+/5 or greater by 03/02/16   Status On-going   PT LONG TERM GOAL #4   Title Pt will report no restrictions in functional use of L UE with normal household ADL's or chores due to L shoulder pain or weakness by 03/02/16   Status Achieved  02/29/16: Pt. with no current restrictions in functional use of L UE with normal household ADL's or chores  at this point at L shoulder due to pain or weakness.                Plan - 02/29/16 1602    Clinical Impression Statement Pt. performed well with all scapular and shoulder strengthening activity today; pt. pain free initially and with all therex. Pt. tolerating advancement of resistance with all scapular strengthening and I's, T's, and y's at 2# dumbbells today.  Pt. progressing toward goals and seeing PT for goal assessment with treatment 11/12 in POC next visit.    PT Treatment/Interventions Patient/family education;Therapeutic exercise;Manual techniques;Passive range of motion;Dry needling;Taping;Therapeutic activities;Ultrasound;Moist Heat;Electrical Stimulation;Cryotherapy;Vasopneumatic Device;Iontophoresis /ml Dexamethasone   PT Next Visit Plan Manual therapy and exercises for L shoulder ROM and strengthening; Scapular stabilization; Modalities as needed for pain      Patient will benefit from skilled therapeutic intervention in order to improve the  following deficits and impairments:  Pain, Decreased range of motion, Impaired flexibility, Decreased strength, Impaired UE functional use  Visit Diagnosis: Pain in left shoulder  Stiffness of left shoulder, not elsewhere classified     Problem List Patient Active Problem List   Diagnosis Date Noted  . Disorder of wrist joint 12/09/2015  . Injury of left shoulder 11/09/2015  . Left wrist injury 11/09/2015  . ADD (attention deficit disorder) 09/14/2015  . Familial hypercholesterolemia 09/14/2015  . Herniated nucleus pulposus 09/14/2015    Kermit Balo, PTA 02/29/2016, 4:18 PM  [facility] [address]  [address] [place], [place], [address] Phone: [phone]   Fax:  [phone]  Name: Dan Villarreal MRN: [id number] Date of Birth: [birth year]

## 2016-03-06 ENCOUNTER — Ambulatory Visit: Payer: Managed Care, Other (non HMO) | Attending: Family Medicine | Admitting: Physical Therapy

## 2016-03-06 DIAGNOSIS — M25612 Stiffness of left shoulder, not elsewhere classified: Secondary | ICD-10-CM | POA: Insufficient documentation

## 2016-03-06 DIAGNOSIS — M25512 Pain in left shoulder: Secondary | ICD-10-CM
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
## 2016-03-13 ENCOUNTER — Ambulatory Visit: Payer: Managed Care, Other (non HMO) | Admitting: Physical Therapy

## 2016-03-13 DIAGNOSIS — M25512 Pain in left shoulder: Secondary | ICD-10-CM

## 2016-03-13 DIAGNOSIS — M25612 Stiffness of left shoulder, not elsewhere classified: Secondary | ICD-10-CM
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
## 2016-03-20 ENCOUNTER — Ambulatory Visit: Payer: Managed Care, Other (non HMO) | Admitting: Physical Therapy

## 2016-03-20 DIAGNOSIS — M25512 Pain in left shoulder: Secondary | ICD-10-CM | POA: Diagnosis not present

## 2016-03-20 DIAGNOSIS — M25612 Stiffness of left shoulder, not elsewhere classified: Secondary | ICD-10-CM

## 2016-03-20 NOTE — Therapy (Signed)
[facility] [address]  [place], [place], [address] Phone: [phone]   Fax:  [phone]  Physical Therapy Treatment  Patient Details  Name: Dan Villarreal MRN: [id number] Date of Birth: 1976-07-26 Referring Provider: Dene Gentry, MD  Encounter Date: 03/20/2016      PT End of Session - 03/20/16 0935    Visit Number 13   Number of Visits 20   Date for PT Re-Evaluation 04/06/16   PT Start Time 0935  Pt arrived late   PT Stop Time 1022   PT Time Calculation (min) 47 min   Activity Tolerance Patient tolerated treatment well   Behavior During Therapy [facility] for tasks assessed/performed      Past Medical History  Diagnosis Date  . ADHD (attention deficit hyperactivity disorder)   . Environmental allergies   . Labral tear of shoulder   . Pain, wrist joint     Past Surgical History  Procedure Laterality Date  . Tonsillectomy and adenoidectomy    . Wrist arthroscopy with debridement Left 01/10/2016    Procedure: ARTHROSCOPY LEFT WRIST DEBRIDEMENT TRIANGULAR FIBROCARTILAGE COMPLEX ;  Surgeon: Daryll Brod, MD;  Location: [place];  Service: Orthopedics;  Laterality: Left;    There were no vitals filed for this visit.      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 Noted some increased pain with swinging a sledge hammer but otherwise did ok during testing.   Patient Stated Goals "To get normal use of my (L) arm back so I can go back to full duty at work and get back to working out."   Currently in Pain? 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   Referring Provider Dene Gentry, MD   Onset Date/Surgical Date 10/30/15   Hand Dominance Right   Next MD Visit 03/26/16   AROM   AROM Assessment Site Shoulder   Right/Left Shoulder Left   Left Shoulder Flexion 162 Degrees   Left Shoulder ABduction 151 Degrees   Left Shoulder Internal Rotation 83 Degrees   Left Shoulder External Rotation 111 Degrees   Strength   Strength Assessment Site Shoulder   Right/Left Shoulder Left   Left Shoulder Flexion --  5-/5   Left Shoulder ABduction --  5-/5   Left Shoulder Internal Rotation 5/5   Left Shoulder External Rotation 4+/5          Today's treatment  TherEx UBE - lvl 3.0, fwd/back x 2' each Doorway stretch low/mid/high x30" each Shoulder flexion/abduction posterior shoulder stretch with hand on upper door frame x30"  Manual STM/stretching to L posterior/inferior shoulder and scapular muscles in R sidelying PROM L shoulder into scaption & abduction in R sidelying Grade 2-3 L shoulder A/P and inferior mobs  TherEx B Shoulder Flexion pullover with 7# x15  R sidelying L Shoulder ER with 3# x15 R sidelying L Shoulder ABD with 3# x15 "W" Row with blue TB x15 TRX chest stretch low/mid/high x30" each TRX Low Row x10 TRX Mid Row x10 TRX Mid Row + ER x10  L Shoulder IR towel stretch 2x30'          PT Long Term Goals - 03/20/16 1022    PT LONG TERM GOAL #1   Title Pt will be independent with latest HEP by 04/06/16   Status Partially Met  03/20/16 - Independent with current HEP   PT LONG TERM GOAL #2   Title Pt will demonstrate full AROM of L shoulder without pain by 04/06/16   Status Partially Met  Met for flexion & ER   PT LONG TERM GOAL #3   Title Pt will demonstrate L shoulder strength 4+/5 or greater w/o pain by 04/06/16   Status Achieved   PT LONG TERM GOAL #4   Title Pt will report no restrictions in functional use of L UE with normal household ADL's or chores due to L shoulder pain or weakness by 04/06/16   Status Partially Met  03/20/16 - Pt  able to functionally use L UE with normal household ADL's or chores at this point, but notes increased pain at L shoulder with overhead motions limited tolerance for some activities 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               Plan - 03/20/16 1022    Clinical Impression Statement Pt reporting more tightness than pain of late at 05./10 intensity. L shoudler ROM continues to improve with flexion and ER essentially equivalent to or greater than L, but abduction and IR remains slightly limited as compared to R. Continued tightness noted in pecs and posterior capsule which has been improving with manual therapy allowing for improved overhead ROM. 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   PT Treatment/Interventions Patient/family education;Therapeutic exercise;Manual techniques;Passive range of motion;Dry needling;Taping;Therapeutic activities;Ultrasound;Moist Heat;Electrical Stimulation;Cryotherapy;Vasopneumatic Device;Iontophoresis 31m/ml Dexamethasone   PT Next Visit Plan Manual therapy and exercises for L shoulder ROM; L shoudler strengthening, especially in overhead range; Scapular stabilization; Modalities as needed for pain   Consulted and Agree with Plan of Care Patient      Patient will benefit from skilled therapeutic intervention in order to improve the following deficits and impairments:  Pain, Decreased range of motion, Impaired flexibility, Decreased strength, Impaired UE functional use  Visit Diagnosis: Pain in left shoulder  Stiffness of left shoulder, not elsewhere classified     Problem List Patient Active Problem List   Diagnosis Date Noted  . Disorder of wrist joint 12/09/2015  . Injury of left shoulder 11/09/2015  . Left wrist injury  11/09/2015  . ADD (attention deficit disorder) 09/14/2015  . Familial hypercholesterolemia 09/14/2015  . Herniated nucleus pulposus 09/14/2015    JPercival Spanish PT, MPT 03/20/2016, 2:45 PM  [facility] [place] [address]Phone: [phone]  Fax:  [phone] Name: Dan RUETERMRN: [id number] of Birth: 609/28/1977

## 2016-03-26 ENCOUNTER — Encounter: Payer: Self-pay | Admitting: Family Medicine

## 2016-03-26 ENCOUNTER — Ambulatory Visit (INDEPENDENT_AMBULATORY_CARE_PROVIDER_SITE_OTHER): Payer: Managed Care, Other (non HMO) | Admitting: Family Medicine

## 2016-03-26 VITALS — BP 144/78 | HR 89 | Ht 74.0 in | Wt 185.0 lb

## 2016-03-26 DIAGNOSIS — S4992XD Unspecified injury of left shoulder and upper arm, subsequent encounter: Secondary | ICD-10-CM | POA: Diagnosis not present

## 2016-03-26 NOTE — Patient Instructions (Signed)
Continue home exercises. I would leave it up to you regarding continuing physical therapy (if you're getting benefit from this) vs weaning to home exercise program only. Follow up with me in 3 months but obviously call me sooner if you're having problems. Your motion should improve as the cyst resolves and you continue your exercises.

## 2016-03-27 ENCOUNTER — Ambulatory Visit: Payer: Managed Care, Other (non HMO) | Admitting: Physical Therapy

## 2016-03-27 DIAGNOSIS — M25512 Pain in left shoulder: Secondary | ICD-10-CM | POA: Diagnosis not present

## 2016-03-27 DIAGNOSIS — M25612 Stiffness of left shoulder, not elsewhere classified: Secondary | ICD-10-CM
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
## 2016-03-27 NOTE — Therapy (Signed)
[facility] [address]  [place], [place], [address] Phone: [phone]   Fax:  [phone]  Physical Therapy Treatment  Patient Details  Name: Dan Villarreal MRN: [id number] Date of Birth: 18-May-1976 Referring Provider: Dene Gentry, MD  Encounter Date: 03/27/2016      PT End of Session - 03/27/16 0808    Visit Number 14   Number of Visits 20   Date for PT Re-Evaluation 04/06/16   PT Start Time 0808  Pt arrived late   PT Stop Time 0844   PT Time Calculation (min) 36 min   Activity Tolerance Patient tolerated treatment well   Behavior During Therapy [facility] for tasks assessed/performed      Past Medical History  Diagnosis Date  . ADHD (attention deficit hyperactivity disorder)   . Environmental allergies   . Labral tear of shoulder   . Pain, wrist joint     Past Surgical History  Procedure Laterality Date  . Tonsillectomy and adenoidectomy    . Wrist arthroscopy with debridement Left 01/10/2016    Procedure: ARTHROSCOPY LEFT WRIST DEBRIDEMENT TRIANGULAR FIBROCARTILAGE COMPLEX ;  Surgeon: Daryll Brod, MD;  Location: [place];  Service: Orthopedics;  Laterality: Left;    There were no vitals filed for this visit.      Subjective Assessment - 03/27/16 0809    Subjective Pt noting mild tightness, with soreness related to that, but no pain. Saw Dr. Barbaraann Barthel yesterday who thinks he still has the cyst which is probably where the limitations in the ROM are still coming from.   Currently in Pain? No/denies            [facility] PT Assessment - 03/27/16 0808    Assessment   Next MD Visit 06/27/16       Today's Treatment  TherEx UBE - lvl 4.0, fwd/back x 2' each  Manual STM/stretching to L posterior/inferior shoulder and scapular muscles  PROM L shoulder flexion with MWM Contract/relax for shoulder flexion ROM Grade 2-3 L shoulder A/P and inferior mobs  TherEx BATCA Pull down 65# x10,  75# x10 BATCA Upward cable row 25# x10 TRX Low Row x10 TRX Mid Row x10 TRX Mid Row + ER x10 TRX chest stretch low/mid/high x30" each           PT Long Term Goals - 03/20/16 1022    PT LONG TERM GOAL #1   Title Pt will be independent with latest HEP by 04/06/16   Status Partially Met  03/20/16 - Independent with current HEP   PT LONG TERM GOAL #2   Title Pt will demonstrate full AROM of L shoulder without pain by 04/06/16   Status Partially Met  Met for flexion & ER   PT LONG TERM GOAL #3   Title Pt will demonstrate L shoulder strength 4+/5 or greater w/o pain by 04/06/16   Status Achieved   PT LONG TERM GOAL #4   Title Pt will report no restrictions in functional use of L UE with normal household ADL's or chores due to L shoulder pain or weakness by 04/06/16   Status Partially Met  03/20/16 - Pt able to functionally use L UE with normal household ADL's or chores at this point, but notes increased pain at L shoulder with overhead motions limited tolerance for some activities 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               Plan - 03/27/16  0844    Clinical Impression Statement Pt noting continued tightness in posterior should but able to demonstrate increased flexion ROM after STM/stretching and MWM. Tightness persisted during strengthening exercises but denies pain and declined ice/vaso.   PT Treatment/Interventions Patient/family education;Therapeutic exercise;Manual techniques;Passive range of motion;Dry needling;Taping;Therapeutic activities;Ultrasound;Moist Heat;Electrical Stimulation;Cryotherapy;Vasopneumatic Device;Iontophoresis 20m/ml Dexamethasone   PT Next Visit Plan Manual therapy and exercises for L shoulder ROM; L shoudler strengthening, especially in overhead range; Scapular stabilization; Modalities as needed for pain   Consulted and Agree with Plan of Care Patient      Patient will benefit from skilled therapeutic intervention in order to improve the following  deficits and impairments:  Pain, Decreased range of motion, Impaired flexibility, Decreased strength, Impaired UE functional use  Visit Diagnosis: Pain in left shoulder  Stiffness of left shoulder, not elsewhere classified     Problem List Patient Active Problem List   Diagnosis Date Noted  . Disorder of wrist joint 12/09/2015  . Injury of left shoulder 11/09/2015  . Left wrist injury 11/09/2015  . ADD (attention deficit disorder) 09/14/2015  . Familial hypercholesterolemia 09/14/2015  . Herniated nucleus pulposus 09/14/2015    JPercival Spanish PT, MPT 03/27/2016, 8:48 AM  [facility] [place] [address]Phone: [phone]  Fax:  [phone] Name: TDYON ROTERTMRN: [id number] of Birth: 607/13/1977
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
## 2016-04-05 ENCOUNTER — Ambulatory Visit: Payer: Managed Care, Other (non HMO) | Attending: Family Medicine | Admitting: Physical Therapy

## 2016-04-05 DIAGNOSIS — M25512 Pain in left shoulder: Secondary | ICD-10-CM | POA: Diagnosis present

## 2016-04-05 DIAGNOSIS — M25612 Stiffness of left shoulder, not elsewhere classified: Secondary | ICD-10-CM

## 2016-04-05 NOTE — Therapy (Signed)
[facility] [address]  [place], [place], [address] Phone: [phone]   Fax:  [phone]  Physical Therapy Treatment  Patient Details  Name: Dan Villarreal MRN: [id number] Date of Birth: June 26, 1976 Referring Provider: Dene Gentry, MD  Encounter Date: 04/05/2016      PT End of Session - 04/05/16 1106    Visit Number 15   Number of Visits 20   Date for PT Re-Evaluation 04/25/16   PT Start Time 1106  Pt arrived late   PT Stop Time 1145   PT Time Calculation (min) 39 min   Activity Tolerance Patient tolerated treatment well   Behavior During Therapy [facility] for tasks assessed/performed      Past Medical History  Diagnosis Date  . ADHD (attention deficit hyperactivity disorder)   . Environmental allergies   . Labral tear of shoulder   . Pain, wrist joint     Past Surgical History  Procedure Laterality Date  . Tonsillectomy and adenoidectomy    . Wrist arthroscopy with debridement Left 01/10/2016    Procedure: ARTHROSCOPY LEFT WRIST DEBRIDEMENT TRIANGULAR FIBROCARTILAGE COMPLEX ;  Surgeon: Daryll Brod, MD;  Location: [place];  Service: Orthopedics;  Laterality: Left;    There were no vitals filed for this visit.      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   Patient Stated Goals "To get normal use of my (L) arm back so I can go back to full duty at work and get back to working out."   Currently in Pain? No/denies           Today's Treatment  TherEx UBE - lvl 4.0, fwd/back x 2' each  Manual STM/stretching to L posterior/inferior shoulder and scapular muscles  PROM L shoulder flexion with MWM Contract/relax for shoulder flexion ROM Grade 3-4 L shoulder A/P and inferior mobs  TherEx Prone over green (65 cm) Pball - I's, T's, W's & Y's 2# x15  each B Shoulder Row at 90 dg ABD + ER with blue TB x15 Prone plank position alternating shoulder protraction rock on inverted BOSU x10 BATCA Upward cable row 35# x10 BATCA Pull down 75# x10 TRX Low Row x15 TRX Mid Row + ER x15 TRX Push-up x15           PT Long Term Goals - 04/05/16 1145    PT LONG TERM GOAL #1   Title Pt will be independent with latest HEP by 04/25/16   Status Partially Met  Independent with current HEP; plan to update over next few visits   PT LONG TERM GOAL #2   Title Pt will demonstrate full AROM of L shoulder without pain by 04/25/16   Status Partially Met  Met for flexion & IR/ER   PT LONG TERM GOAL #3   Title Pt will demonstrate L shoulder strength 4+/5 or greater w/o pain by 04/06/16   Status Achieved   PT LONG TERM GOAL #4   Title Pt will report no restrictions in functional use of L UE with normal household ADL's or chores due to L shoulder pain or weakness by 04/25/16   Status Partially Met  03/20/16 - Pt able to functionally use L UE with normal household ADL's or chores at this point, but notes increased pain at L shoulder with overhead motions  limited tolerance for some activities 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               Plan - 04/05/16 1145    Clinical Impression Statement Pt reporting good tolerance for activities on vacation including scuba diving with only mild soreness noted. Continues to note tightness in posterior shoulder but ROM continues with only mild restriction noted in inferior capusule. PT tolerating strengthening progression well. Pt currently with 3 scheduled visits remaining (5 remaining in POC) and discussed plan to focus on transition to HEP over next few visits with plan for D/C on last scheduled visit.   PT Treatment/Interventions Patient/family education;Therapeutic exercise;Manual techniques;Passive range of motion;Dry needling;Taping;Therapeutic activities;Ultrasound;Moist Heat;Electrical  Stimulation;Cryotherapy;Vasopneumatic Device;Iontophoresis 75m/ml Dexamethasone   PT Next Visit Plan Prepare for transition to HEP; Manual therapy and exercises for L shoulder ROM; L shoudler strengthening, especially in overhead range; Scapular stabilization; Modalities as needed for pain   Consulted and Agree with Plan of Care Patient      Patient will benefit from skilled therapeutic intervention in order to improve the following deficits and impairments:  Pain, Decreased range of motion, Impaired flexibility, Decreased strength, Impaired UE functional use  Visit Diagnosis: Pain in left shoulder  Stiffness of left shoulder, not elsewhere classified     Problem List Patient Active Problem List   Diagnosis Date Noted  . Disorder of wrist joint 12/09/2015  . Injury of left shoulder 11/09/2015  . Left wrist injury 11/09/2015  . ADD (attention deficit disorder) 09/14/2015  . Familial hypercholesterolemia 09/14/2015  . Herniated nucleus pulposus 09/14/2015    JPercival Spanish PT, MPT 04/05/2016, 12:09 PM  [facility] [place] [address]Phone: [phone]  Fax:  [phone] Name: Dan Villarreal: [id number] of Birth: 603-27-1977

## 2016-04-11 ENCOUNTER — Ambulatory Visit: Payer: Managed Care, Other (non HMO) | Admitting: Physical Therapy

## 2016-04-11 DIAGNOSIS — M25512 Pain in left shoulder: Secondary | ICD-10-CM | POA: Diagnosis not present

## 2016-04-11 DIAGNOSIS — M25612 Stiffness of left shoulder, not elsewhere classified: Secondary | ICD-10-CM
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
## 2016-04-18 ENCOUNTER — Ambulatory Visit: Payer: Managed Care, Other (non HMO) | Admitting: Physical Therapy

## 2016-04-18 DIAGNOSIS — M25612 Stiffness of left shoulder, not elsewhere classified: Secondary | ICD-10-CM

## 2016-04-18 DIAGNOSIS — M25512 Pain in left shoulder: Secondary | ICD-10-CM | POA: Diagnosis not present
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
## 2016-04-25 ENCOUNTER — Ambulatory Visit: Payer: Managed Care, Other (non HMO) | Admitting: Physical Therapy

## 2016-06-27 ENCOUNTER — Encounter: Payer: Self-pay | Admitting: Family Medicine

## 2016-06-27 ENCOUNTER — Ambulatory Visit (INDEPENDENT_AMBULATORY_CARE_PROVIDER_SITE_OTHER): Payer: Managed Care, Other (non HMO) | Admitting: Family Medicine

## 2016-06-27 VITALS — BP 127/81 | HR 80 | Ht 74.0 in | Wt 190.0 lb

## 2016-06-27 DIAGNOSIS — S4992XD Unspecified injury of left shoulder and upper arm, subsequent encounter: Secondary | ICD-10-CM | POA: Diagnosis not present

## 2016-06-27 NOTE — Patient Instructions (Signed)
We will go ahead with [facility]. Call me if you have any questions.
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
## 2017-03-11 ENCOUNTER — Encounter: Payer: Self-pay | Admitting: Physical Therapy
